# Patient Record
Sex: Male | Born: 1965 | Race: White | Hispanic: No | Marital: Married | Smoking: Former smoker
Health system: Southern US, Community
[De-identification: ages and names within clinical notes are randomized; demographics above are authoritative.]

## PROBLEM LIST (undated history)

## (undated) DIAGNOSIS — J45909 Unspecified asthma, uncomplicated: Secondary | ICD-10-CM

## (undated) DIAGNOSIS — E785 Hyperlipidemia, unspecified: Secondary | ICD-10-CM

## (undated) DIAGNOSIS — I10 Essential (primary) hypertension: Secondary | ICD-10-CM

## (undated) DIAGNOSIS — F329 Major depressive disorder, single episode, unspecified: Secondary | ICD-10-CM

## (undated) DIAGNOSIS — F32A Depression, unspecified: Secondary | ICD-10-CM

## (undated) DIAGNOSIS — R2230 Localized swelling, mass and lump, unspecified upper limb: Secondary | ICD-10-CM

## (undated) HISTORY — DX: Hyperlipidemia, unspecified: E78.5

## (undated) HISTORY — DX: Depression, unspecified: F32.A

## (undated) HISTORY — DX: Essential (primary) hypertension: I10

## (undated) HISTORY — DX: Localized swelling, mass and lump, unspecified upper limb: R22.30

## (undated) HISTORY — DX: Unspecified asthma, uncomplicated: J45.909

## (undated) HISTORY — DX: Major depressive disorder, single episode, unspecified: F32.9

## (undated) HISTORY — PX: KNEE CARTILAGE SURGERY: SHX688

## (undated) SURGERY — EXCISION MASS UPPER EXTREMITIES
Anesthesia: General | Laterality: Left

---

## 1998-09-17 HISTORY — PX: OTHER SURGICAL HISTORY: SHX169

## 2004-09-17 HISTORY — PX: NASAL SINUS SURGERY: SHX719

## 2009-09-17 HISTORY — PX: LAPAROSCOPIC SMALL BOWEL RESECTION: SUR793

## 2015-09-18 DIAGNOSIS — R2230 Localized swelling, mass and lump, unspecified upper limb: Secondary | ICD-10-CM

## 2015-09-18 HISTORY — DX: Localized swelling, mass and lump, unspecified upper limb: R22.30

## 2016-04-19 ENCOUNTER — Ambulatory Visit (INDEPENDENT_AMBULATORY_CARE_PROVIDER_SITE_OTHER): Payer: No Typology Code available for payment source | Admitting: Surgery

## 2016-04-19 ENCOUNTER — Encounter: Payer: Self-pay | Admitting: Surgery

## 2016-04-19 VITALS — BP 143/88 | HR 83 | Temp 98.5°F | Ht 71.0 in | Wt 267.4 lb

## 2016-04-19 DIAGNOSIS — M25512 Pain in left shoulder: Secondary | ICD-10-CM

## 2016-04-19 DIAGNOSIS — M259 Joint disorder, unspecified: Secondary | ICD-10-CM | POA: Diagnosis not present

## 2016-04-19 DIAGNOSIS — R2230 Localized swelling, mass and lump, unspecified upper limb: Secondary | ICD-10-CM | POA: Insufficient documentation

## 2016-04-19 NOTE — Progress Notes (Signed)
Subjective:     Patient ID: Sean Cain, male   DOB: [birth year], 50 y.o.   MRN: [id number]  HPI  50 year old male who is referred by the [facility] for left shoulder mass that has been there for about 5 years. Patient states that his been soft and fairly mobile and he noticed that it been getting bigger over the past few months. Patient states that it has been causing him pain over past few months as well.  Patient states this pain is down his arm and burning shooting pain. Patient states that this does happen more with moving the arm but sometimes can be whenever he is just sitting resting. Patient states this area has never become red and has never been draining but is beginning to cause him more pain and discomfort at this time. Patient did have a lipoma removed from his back will years ago. Otherwise he has had no other lesions removed. Patient himself does have a history of Crohn's disease and has been on both Remicade and now Humira and is on chronic prednisone for management of this.  Past Medical History:  Diagnosis Date  . Depression   . Hyperlipidemia   . Hypertension    Past Surgical History:  Procedure Laterality Date  . KNEE CARTILAGE SURGERY  2002, 2005  . LAPAROSCOPIC SMALL BOWEL RESECTION  2011  . NASAL SINUS SURGERY  2006  . nissenfundoplication  3474   Family History  Problem Relation Age of Onset  . Alzheimer's disease Mother   . Heart disease Father    Social History   Social History  . Marital status: Married    Spouse name: N/A  . Number of children: N/A  . Years of education: N/A   Social History Main Topics  . Smoking status: Former Smoker    Packs/day: 1.00    Years: 10.00    Quit date: 04/19/1998  . Smokeless tobacco: Former Systems developer  . Alcohol use No  . Drug use: No  . Sexual activity: Not Asked   Other Topics Concern  . None   Social History Narrative  . None    Current Outpatient Prescriptions:  .  Adalimumab (HUMIRA PEN-CROHNS STARTER) 40  MG/0.8ML PNKT, Inject 40 mg into the skin once a week., Disp: , Rfl:  .  ARIPiprazole (ABILIFY) 10 MG tablet, Take 10 mg by mouth daily., Disp: , Rfl:  .  atorvastatin (LIPITOR) 40 MG tablet, Take 40 mg by mouth daily., Disp: , Rfl:  .  buPROPion (ZYBAN) 150 MG 12 hr tablet, Take 150 mg by mouth 2 (two) times daily., Disp: , Rfl:  .  Cetirizine HCl (ZYRTEC ALLERGY) 10 MG CAPS, Take 1 tablet by mouth daily., Disp: , Rfl:  .  Cyclobenzaprine HCl-Liniment (CYCLOBENZAPRINE COMFORT PAC) 10 MG KIT, Take 1 tablet by mouth daily., Disp: , Rfl:  .  DULoxetine (CYMBALTA) 60 MG capsule, Take 60 mg by mouth daily., Disp: , Rfl:  .  hydrochlorothiazide (HYDRODIURIL) 12.5 MG tablet, Take 12.5 mg by mouth daily., Disp: , Rfl:  .  HYDROcodone-acetaminophen (NORCO/VICODIN) 5-325 MG tablet, Take 1 tablet by mouth every 6 (six) hours as needed for moderate pain., Disp: , Rfl:  .  lisinopril (PRINIVIL,ZESTRIL) 20 MG tablet, Take 20 mg by mouth daily., Disp: , Rfl:  .  meloxicam (MOBIC) 15 MG tablet, Take by mouth., Disp: , Rfl:  .  mesalamine (CANASA) 1000 MG suppository, Place 1,000 mg rectally at bedtime., Disp: , Rfl:  .  prednisoLONE 5 MG  TABS tablet, Take by mouth., Disp: , Rfl:  .  pregabalin (LYRICA) 100 MG capsule, Take 100 mg by mouth 3 (three) times daily., Disp: , Rfl:  Allergies  Allergen Reactions  . Flonase [Fluticasone Propionate]   . Remicade [Infliximab]   . Singulair [Montelukast Sodium]       Review of Systems  Constitutional: Negative for activity change, appetite change, chills, fatigue, fever and unexpected weight change.  HENT: Negative for congestion, sore throat and voice change.   Respiratory: Negative for cough, choking, shortness of breath and wheezing.   Cardiovascular: Negative for chest pain, palpitations and leg swelling.  Gastrointestinal: Negative for abdominal pain, blood in stool, constipation, diarrhea and nausea.  Genitourinary: Negative for dysuria and flank pain.   Musculoskeletal: Negative for back pain, gait problem and joint swelling.  Skin: Negative for color change, pallor, rash and wound.  Neurological: Negative for dizziness and seizures.  Hematological: Negative for adenopathy. Does not bruise/bleed easily.  Psychiatric/Behavioral: Negative for agitation.  All other systems reviewed and are negative.  Vitals:   04/19/16 1555  BP: (!) 143/88  Pulse: 83  Temp: 98.5 F (36.9 C)       Objective:   Physical Exam  Constitutional: He is oriented to person, place, and time. He appears well-developed and well-nourished. No distress.  HENT:  Head: Normocephalic and atraumatic.  Right Ear: External ear normal.  Left Ear: External ear normal.  Nose: Nose normal.  Mouth/Throat: Oropharynx is clear and moist. No oropharyngeal exudate.  Eyes: Conjunctivae and EOM are normal. Pupils are equal, round, and reactive to light. No scleral icterus.  Neck: Normal range of motion. Neck supple. No tracheal deviation present.  Cardiovascular: Normal rate, regular rhythm, normal heart sounds and intact distal pulses.  Exam reveals no gallop and no friction rub.   No murmur heard. Pulmonary/Chest: Effort normal and breath sounds normal. No respiratory distress. He has no wheezes. He has no rales.  Abdominal: Soft. Bowel sounds are normal. He exhibits no distension. There is no tenderness. There is no rebound.  Musculoskeletal: Normal range of motion. He exhibits tenderness. He exhibits no edema or deformity.  Neurological: He is alert and oriented to person, place, and time. No cranial nerve deficit.  Skin: Skin is warm and dry. No rash noted. No erythema. No pallor.  Left anterior shoulder: soft, mass, fixed, no freely mobile, approximately 8cm x 12cm in size just lateral to the joint capsule, no erythema or other skin markings, the mass moves with arm and shoulder movements, tender to palpation  Psychiatric: He has a normal mood and affect. His behavior is  normal. Judgment and thought content normal.  Vitals reviewed.      Assessment:     Left shoulder lesion   Plan:     I have personally reviewed the patient's medical history as sent by the [facility] as well as per patient report as above with his Crohn's disease and being on Humira and prednisone. I do not have any laboratory values from the [place] and there are no imaging studies of this lesion.  I discussed that given the soft rubbery nature of the lesion that is likely a lipoma however given the fact that is increased in size and causing pain down the arm as well as moving with the muscles would indicate that it does have a deeper component in could be attached to the fascia of the muscles or even ingrown into the muscle tissue. In order to assess the feasibility of  removal and its relation to tendons of the shoulder and in order an MRI of his left shoulder without contrast. The patient and wife understand the need for this test to assess the anatomy in the area and the depth of invasion of this lesion and this would give [place] a better idea of what type of lesion were dealing with. I will have the patient follow-up with me after he obtains the MRI first and then schedule his potential operation.  However the patient understands that if it were to be ingrown into muscle tissue or close to the tendons in the arm that may need the help of the orthopedic surgeon as well.

## 2016-04-19 NOTE — Patient Instructions (Signed)
We will schedule an MRI of your Left shoulder and call you with the appointment date and time and place. We will also schedule a follow up appointment with Dr.Loflin after your MRI to discuss the results.  Please call our office if you have any questions or concerns.

## 2016-04-20 ENCOUNTER — Telehealth: Payer: Self-pay

## 2016-04-20 NOTE — Telephone Encounter (Signed)
Spoke with patient at this time to let him know I have scheduled his MRI scheduled on 05/04/16 @ 9:45.  Follow up appointment is on 05/29/16 @8 :30 with Dr.Loflin in the [facility] office. Patient verbalized understanding.  MRI -[facility]  [place]

## 2016-05-04 ENCOUNTER — Ambulatory Visit
Admission: RE | Admit: 2016-05-04 | Discharge: 2016-05-04 | Disposition: A | Discharge status: Home / Self Care | Payer: No Typology Code available for payment source | Source: Ambulatory Visit | Attending: Surgery | Admitting: Surgery

## 2016-05-04 DIAGNOSIS — M25412 Effusion, left shoulder: Secondary | ICD-10-CM | POA: Insufficient documentation

## 2016-05-04 DIAGNOSIS — M25512 Pain in left shoulder: Secondary | ICD-10-CM | POA: Insufficient documentation

## 2016-05-04 DIAGNOSIS — D179 Benign lipomatous neoplasm, unspecified: Secondary | ICD-10-CM | POA: Insufficient documentation

## 2016-05-08 ENCOUNTER — Ambulatory Visit (INDEPENDENT_AMBULATORY_CARE_PROVIDER_SITE_OTHER): Payer: Self-pay

## 2016-05-08 ENCOUNTER — Other Ambulatory Visit: Payer: Self-pay | Admitting: Adult Health

## 2016-05-08 DIAGNOSIS — Z0000 Encounter for general adult medical examination without abnormal findings: Secondary | ICD-10-CM

## 2016-05-08 DIAGNOSIS — M79672 Pain in left foot: Secondary | ICD-10-CM

## 2016-05-29 ENCOUNTER — Ambulatory Visit: Payer: Self-pay | Admitting: Surgery

## 2016-06-04 ENCOUNTER — Ambulatory Visit: Payer: Self-pay | Admitting: Surgery

## 2016-06-07 ENCOUNTER — Ambulatory Visit (INDEPENDENT_AMBULATORY_CARE_PROVIDER_SITE_OTHER): Payer: No Typology Code available for payment source | Admitting: Surgery

## 2016-06-07 ENCOUNTER — Encounter: Payer: Self-pay | Admitting: Surgery

## 2016-06-07 VITALS — BP 134/79 | HR 84 | Temp 98.2°F | Ht 71.0 in | Wt 267.0 lb

## 2016-06-07 DIAGNOSIS — D1720 Benign lipomatous neoplasm of skin and subcutaneous tissue of unspecified limb: Secondary | ICD-10-CM | POA: Insufficient documentation

## 2016-06-07 NOTE — Progress Notes (Signed)
Subjective:     Patient ID: Sean Cain, male   DOB: [birth year], 50 y.o.   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 The patient was sent for an MRI in the meantime to ensure that this did not have any sarcoma characteristics. The patient states he still been having left shoulder pain and difficulty raising his arm due to the size of the lesion. Patient does not think it's been getting any bigger. Patient is not noticing any erythema or drainage over the area. Patient had any fever chills nausea vomiting diarrhea constipation or dysuria.  Past Medical History:  Diagnosis Date  . Depression   . Hyperlipidemia   . Hypertension   . Shoulder mass 2017   Past Surgical History:  Procedure Laterality Date  . KNEE CARTILAGE SURGERY  2002, 2005  . LAPAROSCOPIC SMALL BOWEL RESECTION  2011  . NASAL SINUS SURGERY  2006  . nissenfundoplication  0093   Family History  Problem Relation Age of Onset  . Alzheimer's disease Mother   . Heart disease Father    Social History   Social History  . Marital status: Married    Spouse name: N/A  . Number of children: N/A  . Years of education: N/A   Social History Main Topics  . Smoking status: Former Smoker    Packs/day: 1.00    Years: 10.00    Quit date: 04/19/1998  . Smokeless tobacco: Former Systems developer  . Alcohol use No  . Drug use: No  . Sexual activity: Not Asked   Other Topics Concern  . None   Social History Narrative  . None    Current Outpatient Prescriptions:  .  Adalimumab (HUMIRA PEN-CROHNS STARTER) 40 MG/0.8ML PNKT, Inject 40 mg into the skin once a week., Disp: , Rfl:  .  ARIPiprazole (ABILIFY) 10 MG tablet, Take 10 mg by mouth daily., Disp: , Rfl:  .  atorvastatin (LIPITOR) 40 MG tablet, Take 40 mg by mouth daily., Disp: , Rfl:  .  buPROPion (ZYBAN) 150 MG 12 hr tablet, Take 150 mg by mouth 2 (two) times daily., Disp: , Rfl:  .  Cetirizine HCl (ZYRTEC ALLERGY) 10 MG CAPS,  Take 1 tablet by mouth daily., Disp: , Rfl:  .  Cyclobenzaprine HCl-Liniment (CYCLOBENZAPRINE COMFORT PAC) 10 MG KIT, Take 1 tablet by mouth daily., Disp: , Rfl:  .  DULoxetine (CYMBALTA) 60 MG capsule, Take 60 mg by mouth daily., Disp: , Rfl:  .  hydrochlorothiazide (HYDRODIURIL) 12.5 MG tablet, Take 12.5 mg by mouth daily., Disp: , Rfl:  .  HYDROcodone-acetaminophen (NORCO/VICODIN) 5-325 MG tablet, Take 1 tablet by mouth every 6 (six) hours as needed for moderate pain., Disp: , Rfl:  .  lisinopril (PRINIVIL,ZESTRIL) 20 MG tablet, Take 20 mg by mouth daily., Disp: , Rfl:  .  pregabalin (LYRICA) 100 MG capsule, Take 100 mg by mouth 3 (three) times daily., Disp: , Rfl:  Allergies  Allergen Reactions  . Flonase [Fluticasone Propionate]   . Remicade [Infliximab]   . Singulair [Montelukast Sodium]   . Montelukast Other (See Comments)    Other Reaction: Other reaction      Review of Systems  Constitutional: Negative.   HENT: Negative.   Respiratory: Negative.   Cardiovascular: Negative.   Gastrointestinal: Negative.   Genitourinary: Negative.   Musculoskeletal: Negative.   Skin: Negative.   Neurological: Negative.   Hematological: Negative.   Psychiatric/Behavioral: Negative.   All other systems  reviewed and are negative.      Vitals:   06/07/16 0840  BP: 134/79  Pulse: 84  Temp: 98.2 F (36.8 C)    Objective:   Physical Exam  Constitutional: He is oriented to person, place, and time. He appears well-developed and well-nourished. No distress.  HENT:  Head: Normocephalic and atraumatic.  Right Ear: External ear normal.  Left Ear: External ear normal.  Nose: Nose normal.  Mouth/Throat: Oropharynx is clear and moist. No oropharyngeal exudate.  Eyes: Conjunctivae and EOM are normal. Pupils are equal, round, and reactive to light. No scleral icterus.  Neck: Normal range of motion. Neck supple. No tracheal deviation present.  Cardiovascular: Normal rate, regular rhythm,  normal heart sounds and intact distal pulses.  Exam reveals no gallop and no friction rub.   No murmur heard. Pulmonary/Chest: Effort normal and breath sounds normal. No respiratory distress. He has no wheezes. He has no rales.  Abdominal: Soft. Bowel sounds are normal. He exhibits no distension. There is no tenderness. There is no rebound.  Musculoskeletal: Normal range of motion. He exhibits no edema, tenderness or deformity.  Neurological: He is alert and oriented to person, place, and time. No cranial nerve deficit.  Skin: Skin is warm and dry. No rash noted. No erythema.  Left upper arm: 11cm x 4cm mobile soft lipoma, minimally tender with manipulation, no erythema or drainage  Psychiatric: He has a normal mood and affect. His behavior is normal. Judgment and thought content normal.  Vitals reviewed.  MRI:  Muscles: No focal muscular atrophy or edema. There is a lipoma laterally within the deltoid muscle which measures approximately 11.3 x 5.0 x 4.0 cm. The inferior extent of this is incompletely visualized. The lipoma is well-defined with mild interdigitating vessels and muscle, but no worrisome features.    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 I have also reviewed the radiology read as above. I did discuss with him that when lipomas of this large there is a small chance it could be a sarcoma however with a good characteristics seen on MRI that is more unlikely. I discussed with him the risk benefits alternatives and expected outcomes of removing this area to include the risk of bleeding, infection, need for drain placement, need for overnight hospital stay, damage to muscle or nerves, bleeding requiring blood transfusion, seroma or hematoma that would require drainage or further procedures and  potentially interfering with the tattoo over the area. Patient is given opportunity to ask questions and have them answered. Patient is in agreement with this plan due to family situations he is unable to have this until November we'll schedule that time.

## 2016-06-07 NOTE — Patient Instructions (Signed)
Please call our office if you have questions or concerns. Please see blue sheet for surgery information. We have your surgery scheduled for 07/24/16 with Dr.Loflin.

## 2016-06-08 ENCOUNTER — Telehealth: Payer: Self-pay | Admitting: Surgery

## 2016-06-08 NOTE — Telephone Encounter (Signed)
Pt advised of pre op date/time and sx date. Sx: 06/19/16 with Dr Loflin--Excision of left arm lipoma.  Pre op: 06/13/16 between 1-5:00pm--Phone.   Patient made aware to call [phone], between 1-3:00pm the day before surgery, to find out what time to arrive.

## 2016-06-12 ENCOUNTER — Telehealth: Payer: Self-pay | Admitting: Surgery

## 2016-06-12 NOTE — Telephone Encounter (Signed)
Patient has surgery scheduled on October 3rd with Dr Azalee Course. He said he spoke with you earlier about possibly changing his date. He has decided he would like to change the date to October 20th with Dr Burt Knack so that his wife can be with him during surgery. Please call and advise.

## 2016-06-13 ENCOUNTER — Encounter
Admission: RE | Admit: 2016-06-13 | Discharge: 2016-06-13 | Disposition: A | Discharge status: Home / Self Care | Payer: No Typology Code available for payment source | Source: Ambulatory Visit | Attending: Surgery | Admitting: Surgery

## 2016-06-13 ENCOUNTER — Telehealth: Payer: Self-pay

## 2016-06-13 ENCOUNTER — Other Ambulatory Visit: Payer: Self-pay

## 2016-06-13 DIAGNOSIS — D1720 Benign lipomatous neoplasm of skin and subcutaneous tissue of unspecified limb: Secondary | ICD-10-CM

## 2016-06-13 NOTE — Patient Instructions (Addendum)
Your procedure is scheduled on: 07/04/16 Report to Day Surgery. To find out your arrival time please call [phone] between 1PM - 3PM on 07/03/16.  Remember: Instructions that are not followed completely may result in serious medical risk, up to and including death, or upon the discretion of your surgeon and anesthesiologist your surgery may need to be rescheduled.    __X__ 1. Do not eat food or drink liquids after midnight. No gum chewing or hard candies.     __X__ 2. No Alcohol/ NO SMOKING for 24 hours before or after surgery.   ____ 3. Bring all medications with you on the day of surgery if instructed.    __X__ 4. Notify your doctor if there is any change in your medical condition     (cold, fever, infections).     Do not wear jewelry, make-up, hairpins, clips or nail polish.  Do not wear lotions, powders, or perfumes.   Do not shave 48 hours prior to surgery. Men may shave face and neck.  Do not bring valuables to the hospital.    [facility] is not responsible for any belongings or valuables.               Contacts, dentures or bridgework may not be worn into surgery.  Leave your suitcase in the car. After surgery it may be brought to your room.  For patients admitted to the hospital, discharge time is determined by your                treatment team.   Patients discharged the day of surgery will not be allowed to drive home.   Please read over the following fact sheets that you were given:      __X__ Take these medicines the morning of surgery with A SIP OF WATER:    1. ABILIFY  2. BUPROPION  3. CYMBALTA  4. LISINOPRIL  5. LYRICA  6.  ____ Fleet Enema (as directed)   __X__ Use CHG Soap as directed  ____ Use inhalers on the day of surgery  ____ Stop metformin 2 days prior to surgery    ____ Take 1/2 of usual insulin dose the night before surgery and none on the morning of surgery.   ____ Stop Coumadin/Plavix/aspirin on   ____ Stop Anti-inflammatories on     ____ Stop supplements until after surgery.    ____ Bring C-Pap to the hospital.

## 2016-06-13 NOTE — Telephone Encounter (Signed)
Patient would like to reschedule his surgery once again from 10/18 to 10/19 with Dr. Burt Knack. His wife will not be in town on 07/04/16.  Please call with new surgery information.

## 2016-06-13 NOTE — Telephone Encounter (Signed)
Per patient's request and approval from Dr Azalee Course, patient's surgery has been moved to accomodates patient's request.   Pt advised of pre op date/time and sx date. Sx: 07/04/16 with Dr Loflin--Excision of left arm lipoma.  Pre op: 06/13/16 between 1-5:00pm--phone.   Patient made aware to call [phone], between 1-3:00pm the day before surgery, to find out what time to arrive.

## 2016-06-14 NOTE — Telephone Encounter (Signed)
Surgery has been changed to 07/05/16 with Dr Burt Knack. Patient is scheduled to see Dr Burt Knack in the office on 06/18/16.   I will review all surgery information with the patient once he has his office visit.

## 2016-06-15 ENCOUNTER — Inpatient Hospital Stay: Admission: RE | Admit: 2016-06-15 | Payer: No Typology Code available for payment source | Source: Ambulatory Visit

## 2016-06-18 ENCOUNTER — Ambulatory Visit (INDEPENDENT_AMBULATORY_CARE_PROVIDER_SITE_OTHER): Payer: No Typology Code available for payment source | Admitting: Surgery

## 2016-06-18 ENCOUNTER — Encounter: Payer: Self-pay | Admitting: Surgery

## 2016-06-18 ENCOUNTER — Ambulatory Visit: Payer: No Typology Code available for payment source | Admitting: Surgery

## 2016-06-18 VITALS — BP 146/75 | HR 79 | Temp 98.7°F | Ht 73.0 in | Wt 267.0 lb

## 2016-06-18 DIAGNOSIS — D1720 Benign lipomatous neoplasm of skin and subcutaneous tissue of unspecified limb: Secondary | ICD-10-CM | POA: Diagnosis not present

## 2016-06-18 NOTE — Progress Notes (Signed)
Outpatient Surgical Follow Up  06/18/2016  Sean Cain is an 50 y.o. male.   CC: Left shoulder pain  HPI: This patient with left shoulder pain and a growing left shoulder lipoma. He was seen by Dr. Heath Lark who suggested surgery and he wants to schedule a different date. Therefore I was asked see the patient. A she is medically disabled. He states this mass is been there for 5 date years and slowly growing causing him some pain.  Patient has Crohn's disease and has held his Humira dose.  Past Medical History:  Diagnosis Date  . Depression   . Hyperlipidemia   . Hypertension   . Shoulder mass 2017    Past Surgical History:  Procedure Laterality Date  . KNEE CARTILAGE SURGERY  2002, 2005  . LAPAROSCOPIC SMALL BOWEL RESECTION  2011  . NASAL SINUS SURGERY  2006  . nissenfundoplication  [id number]    Family History  Problem Relation Age of Onset  . Alzheimer's disease Mother   . Heart disease Father     Social History:  reports that he quit smoking about 18 years ago. He has a 10.00 pack-year smoking history. He has quit using smokeless tobacco. He reports that he does not drink alcohol or use drugs.  Allergies:  Allergies  Allergen Reactions  . Flonase [Fluticasone Propionate]   . Remicade [Infliximab]   . Singulair [Montelukast Sodium]   . Montelukast Other (See Comments)    Other Reaction: Other reaction    Medications reviewed.   Review of Systems:   Review of Systems  Constitutional: Negative.   HENT: Negative.   Eyes: Negative.   Respiratory: Negative.   Cardiovascular: Negative.   Gastrointestinal: Negative.   Genitourinary: Negative.   Musculoskeletal: Positive for joint pain. Negative for myalgias and neck pain.  Skin: Negative.   Neurological: Negative.   Endo/Heme/Allergies: Negative.   Psychiatric/Behavioral: Negative.      Physical Exam:  BP (!) 146/75   Pulse 79   Temp 98.7 F (37.1 C) (Oral)   Ht 6\' 1"  (1.854 m)   Wt 267 lb (121.1  kg)   BMI 35.23 kg/m   Physical Exam  Constitutional: He is oriented to person, place, and time and well-developed, well-nourished, and in no distress. No distress.  HENT:  Head: Normocephalic and atraumatic.  Eyes: Pupils are equal, round, and reactive to light. Right eye exhibits no discharge. Left eye exhibits no discharge. No scleral icterus.  Neck: Normal range of motion.  Cardiovascular: Normal rate, regular rhythm and normal heart sounds.   Pulmonary/Chest: Effort normal and breath sounds normal. No respiratory distress. He has no wheezes. He has no rales.  Musculoskeletal: Normal range of motion. He exhibits deformity. He exhibits no edema.  Left shoulder mass measuring approximately 10 cm x 4 cm. It is soft and fleshy but non-mobile. This underlies a very complex and colorful tattoo.  Lymphadenopathy:    He has no cervical adenopathy.  Neurological: He is alert and oriented to person, place, and time.  Skin: Skin is warm and dry. No rash noted. He is not diaphoretic. No erythema.  Psychiatric: Mood and affect normal.  Vitals reviewed.     No results found for this or any previous visit (from the past 48 hour(s)). No results found.  Assessment/Plan:  This patient with a large lipoma of the left shoulder to submuscular in nature. I reviewed his MRI films personally. I am in agreement that excision is necessary in this patient but I also  discussed with him the high risk of recurrence with these type of lipomas in the submuscular position and the risks of bleeding infection cosmetic deformity including altering his tattoo that did not bother him and wished to proceed.  Florene Glen, MD, FACS

## 2016-06-18 NOTE — Patient Instructions (Signed)
We have arranged for your Lipoma to be removed on 07/05/16 by Dr. Burt Knack at [facility]. Please see your blue pre-care sheet for further information  Lipoma A lipoma is a noncancerous (benign) tumor that is made up of fat cells. This is a very common type of soft-tissue growth. Lipomas are usually found under the skin (subcutaneous). They may occur in any tissue of the body that contains fat. Common areas for lipomas to appear include the back, shoulders, buttocks, and thighs. Lipomas grow slowly, and they are usually painless. Most lipomas do not cause problems and do not require treatment. CAUSES The cause of this condition is not known. RISK FACTORS This condition is more likely to develop in:  People who are 49-67 years old.  People who have a family history of lipomas. SYMPTOMS A lipoma usually appears as a small, round bump under the skin. It may feel soft or rubbery, but the firmness can vary. Most lipomas are not painful. However, a lipoma may become painful if it is located in an area where it pushes on nerves. DIAGNOSIS A lipoma can usually be diagnosed with a physical exam. You may also have tests to confirm the diagnosis and to rule out other conditions. Tests may include:  Imaging tests, such as a CT scan or MRI.  Removal of a tissue sample to be looked at under a microscope (biopsy). TREATMENT Treatment is not needed for small lipomas that are not causing problems. If a lipoma continues to get bigger or it causes problems, removal is often the best option. Lipomas can also be removed to improve appearance. Removal of a lipoma is usually done with a surgery in which the fatty cells and the surrounding capsule are removed. Most often, a medicine that numbs the area (local anesthetic) is used for this procedure. HOME CARE INSTRUCTIONS  Keep all follow-up visits as directed by your health care provider. This is important. SEEK MEDICAL CARE IF:  Your lipoma becomes larger or  hard.  Your lipoma becomes painful, red, or increasingly swollen. These could be signs of infection or a more serious condition.   This information is not intended to replace advice given to you by your health care provider. Make sure you discuss any questions you have with your health care provider.   Document Released: 08/24/2002 Document Revised: 01/18/2015 Document Reviewed: 08/30/2014 Elsevier Interactive Patient Education [employer].

## 2016-06-20 ENCOUNTER — Telehealth: Payer: Self-pay | Admitting: Surgery

## 2016-06-20 NOTE — Telephone Encounter (Signed)
Pt advised of pre op date/time and sx date. Sx: 07/05/16 with Dr Cooper--Excision mass upper left arm shoulder.  Pre op: 06/15/16--phone.   Patient made aware to call [phone], between 1-3:00pm the day before surgery, to find out what time to arrive.

## 2016-06-22 ENCOUNTER — Encounter
Admission: RE | Admit: 2016-06-22 | Discharge: 2016-06-22 | Disposition: A | Discharge status: Home / Self Care | Payer: No Typology Code available for payment source | Source: Ambulatory Visit | Attending: Surgery | Admitting: Surgery

## 2016-06-22 DIAGNOSIS — D1722 Benign lipomatous neoplasm of skin and subcutaneous tissue of left arm: Secondary | ICD-10-CM | POA: Insufficient documentation

## 2016-06-22 DIAGNOSIS — I10 Essential (primary) hypertension: Secondary | ICD-10-CM | POA: Diagnosis not present

## 2016-06-22 DIAGNOSIS — Z01818 Encounter for other preprocedural examination: Secondary | ICD-10-CM | POA: Diagnosis not present

## 2016-06-22 LAB — CBC WITH DIFFERENTIAL/PLATELET
Basophils Absolute: 0.1 10*3/uL (ref 0–0.1)
Basophils Relative: 1 %
EOS PCT: 5 %
Eosinophils Absolute: 0.4 10*3/uL (ref 0–0.7)
HCT: 42.1 % (ref 40.0–52.0)
HEMOGLOBIN: 14.7 g/dL (ref 13.0–18.0)
LYMPHS ABS: 2.1 10*3/uL (ref 1.0–3.6)
LYMPHS PCT: 29 %
MCH: 31.3 pg (ref 26.0–34.0)
MCHC: 34.9 g/dL (ref 32.0–36.0)
MCV: 89.6 fL (ref 80.0–100.0)
MONOS PCT: 20 %
Monocytes Absolute: 1.5 10*3/uL — ABNORMAL HIGH (ref 0.2–1.0)
NEUTROS PCT: 45 %
Neutro Abs: 3.4 10*3/uL (ref 1.4–6.5)
Platelets: 174 10*3/uL (ref 150–440)
RBC: 4.69 MIL/uL (ref 4.40–5.90)
RDW: 14.6 % — ABNORMAL HIGH (ref 11.5–14.5)
WBC: 7.5 10*3/uL (ref 3.8–10.6)

## 2016-06-22 LAB — COMPREHENSIVE METABOLIC PANEL
ALK PHOS: 74 U/L (ref 38–126)
ALT: 36 U/L (ref 17–63)
AST: 34 U/L (ref 15–41)
Albumin: 4.1 g/dL (ref 3.5–5.0)
Anion gap: 5 (ref 5–15)
BUN: 11 mg/dL (ref 6–20)
CALCIUM: 9.3 mg/dL (ref 8.9–10.3)
CO2: 29 mmol/L (ref 22–32)
CREATININE: 1.54 mg/dL — AB (ref 0.61–1.24)
Chloride: 108 mmol/L (ref 101–111)
GFR, EST AFRICAN AMERICAN: 59 mL/min — AB (ref >60)
GFR, EST NON AFRICAN AMERICAN: 51 mL/min — AB (ref >60)
Glucose, Bld: 67 mg/dL (ref 65–99)
Potassium: 4 mmol/L (ref 3.5–5.1)
Sodium: 142 mmol/L (ref 135–145)
TOTAL PROTEIN: 7.3 g/dL (ref 6.5–8.1)

## 2016-06-22 LAB — PROTIME-INR
INR: 0.97
PROTHROMBIN TIME: 12.9 s (ref 11.4–15.2)

## 2016-06-22 LAB — APTT: aPTT: 29 seconds (ref 24–36)

## 2016-07-04 ENCOUNTER — Other Ambulatory Visit: Payer: Self-pay

## 2016-07-05 ENCOUNTER — Ambulatory Visit: Payer: No Typology Code available for payment source | Admitting: Anesthesiology

## 2016-07-05 ENCOUNTER — Ambulatory Visit
Admission: RE | Admit: 2016-07-05 | Discharge: 2016-07-05 | Disposition: A | Discharge status: Home / Self Care | Payer: No Typology Code available for payment source | Source: Ambulatory Visit | Attending: Surgery | Admitting: Surgery

## 2016-07-05 ENCOUNTER — Encounter
Admission: RE | Disposition: A | Discharge status: Home / Self Care | Payer: Self-pay | Source: Ambulatory Visit | Attending: Surgery

## 2016-07-05 DIAGNOSIS — Z6834 Body mass index (BMI) 34.0-34.9, adult: Secondary | ICD-10-CM | POA: Insufficient documentation

## 2016-07-05 DIAGNOSIS — E669 Obesity, unspecified: Secondary | ICD-10-CM | POA: Insufficient documentation

## 2016-07-05 DIAGNOSIS — D179 Benign lipomatous neoplasm, unspecified: Secondary | ICD-10-CM | POA: Insufficient documentation

## 2016-07-05 DIAGNOSIS — F329 Major depressive disorder, single episode, unspecified: Secondary | ICD-10-CM | POA: Diagnosis not present

## 2016-07-05 DIAGNOSIS — Z87891 Personal history of nicotine dependence: Secondary | ICD-10-CM | POA: Insufficient documentation

## 2016-07-05 DIAGNOSIS — D1722 Benign lipomatous neoplasm of skin and subcutaneous tissue of left arm: Secondary | ICD-10-CM | POA: Diagnosis not present

## 2016-07-05 DIAGNOSIS — E785 Hyperlipidemia, unspecified: Secondary | ICD-10-CM | POA: Insufficient documentation

## 2016-07-05 DIAGNOSIS — I10 Essential (primary) hypertension: Secondary | ICD-10-CM | POA: Insufficient documentation

## 2016-07-05 DIAGNOSIS — Z79899 Other long term (current) drug therapy: Secondary | ICD-10-CM | POA: Diagnosis not present

## 2016-07-05 HISTORY — PX: LIPOMA EXCISION: SHX5283

## 2016-07-05 SURGERY — EXCISION LIPOMA
Anesthesia: General | Laterality: Left

## 2016-07-05 MED ORDER — FENTANYL CITRATE (PF) 100 MCG/2ML IJ SOLN
INTRAMUSCULAR | Status: AC
  Administered 2016-07-05: 25 ug via INTRAVENOUS
  Filled 2016-07-05: qty 2

## 2016-07-05 MED ORDER — BUPIVACAINE-EPINEPHRINE (PF) 0.25% -1:200000 IJ SOLN
INTRAMUSCULAR | Status: DC | PRN
  Administered 2016-07-05: 20 mL via PERINEURAL

## 2016-07-05 MED ORDER — CHLORHEXIDINE GLUCONATE CLOTH 2 % EX PADS
6.0000 | MEDICATED_PAD | Freq: Once | CUTANEOUS | Status: AC
  Administered 2016-07-05: 6 via TOPICAL

## 2016-07-05 MED ORDER — HYDROCODONE-ACETAMINOPHEN 5-325 MG PO TABS
ORAL_TABLET | ORAL | Status: AC
  Administered 2016-07-05: 1 via ORAL
  Filled 2016-07-05: qty 1

## 2016-07-05 MED ORDER — FAMOTIDINE 20 MG PO TABS
ORAL_TABLET | ORAL | Status: AC
  Administered 2016-07-05: 20 mg via ORAL
  Filled 2016-07-05: qty 1

## 2016-07-05 MED ORDER — FENTANYL CITRATE (PF) 100 MCG/2ML IJ SOLN
25.0000 ug | INTRAMUSCULAR | Status: DC | PRN
  Administered 2016-07-05 (×3): 25 ug via INTRAVENOUS

## 2016-07-05 MED ORDER — LACTATED RINGERS IV SOLN
INTRAVENOUS | Status: DC
  Administered 2016-07-05: 10:00:00 via INTRAVENOUS

## 2016-07-05 MED ORDER — FAMOTIDINE 20 MG PO TABS
20.0000 mg | ORAL_TABLET | Freq: Once | ORAL | Status: AC
  Administered 2016-07-05: 20 mg via ORAL

## 2016-07-05 MED ORDER — MIDAZOLAM HCL 5 MG/5ML IJ SOLN
INTRAMUSCULAR | Status: DC | PRN
  Administered 2016-07-05: 2 mg via INTRAVENOUS

## 2016-07-05 MED ORDER — HYDROCODONE-ACETAMINOPHEN 5-325 MG PO TABS
1.0000 | ORAL_TABLET | Freq: Once | ORAL | Status: AC
  Administered 2016-07-05: 1 via ORAL

## 2016-07-05 MED ORDER — PHENYLEPHRINE HCL 10 MG/ML IJ SOLN
INTRAMUSCULAR | Status: DC | PRN
  Administered 2016-07-05 (×3): 100 ug via INTRAVENOUS

## 2016-07-05 MED ORDER — BUPIVACAINE-EPINEPHRINE (PF) 0.25% -1:200000 IJ SOLN
INTRAMUSCULAR | Status: AC
  Filled 2016-07-05: qty 30

## 2016-07-05 MED ORDER — HYDROCODONE-ACETAMINOPHEN 7.5-325 MG PO TABS: 1.0000 | ORAL_TABLET | Freq: Once | ORAL | Status: DC

## 2016-07-05 MED ORDER — PROPOFOL 10 MG/ML IV BOLUS
INTRAVENOUS | Status: DC | PRN
  Administered 2016-07-05: 170 mg via INTRAVENOUS

## 2016-07-05 MED ORDER — LIDOCAINE HCL (CARDIAC) 20 MG/ML IV SOLN
INTRAVENOUS | Status: DC | PRN
  Administered 2016-07-05: 100 mg via INTRAVENOUS

## 2016-07-05 MED ORDER — GLYCOPYRROLATE 0.2 MG/ML IJ SOLN
INTRAMUSCULAR | Status: DC | PRN
  Administered 2016-07-05: 0.2 mg via INTRAVENOUS

## 2016-07-05 MED ORDER — FENTANYL CITRATE (PF) 100 MCG/2ML IJ SOLN
INTRAMUSCULAR | Status: DC | PRN
  Administered 2016-07-05 (×2): 50 ug via INTRAVENOUS

## 2016-07-05 MED ORDER — CEFAZOLIN SODIUM-DEXTROSE 2-4 GM/100ML-% IV SOLN
2.0000 g | INTRAVENOUS | Status: AC
  Administered 2016-07-05: 2 g via INTRAVENOUS

## 2016-07-05 MED ORDER — ONDANSETRON HCL 4 MG/2ML IJ SOLN
INTRAMUSCULAR | Status: DC | PRN
  Administered 2016-07-05: 4 mg via INTRAVENOUS

## 2016-07-05 MED ORDER — HYDROCODONE-ACETAMINOPHEN 5-325 MG PO TABS: 1.0000 | ORAL_TABLET | ORAL | 0 refills | Status: DC | PRN

## 2016-07-05 MED ORDER — ONDANSETRON HCL 4 MG/2ML IJ SOLN: 4.0000 mg | Freq: Once | INTRAMUSCULAR | Status: DC | PRN

## 2016-07-05 SURGICAL SUPPLY — 31 items
ADHESIVE MASTISOL STRL (MISCELLANEOUS) ×3 IMPLANT
APPLIER CLIP 9.375 SM OPEN (CLIP) ×3
BLADE SURG 15 STRL LF DISP TIS (BLADE) ×1 IMPLANT
BLADE SURG 15 STRL SS (BLADE) ×2
CHLORAPREP W/TINT 26ML (MISCELLANEOUS) ×3 IMPLANT
CLIP APPLIE 9.375 SM OPEN (CLIP) ×1 IMPLANT
CLOSURE WOUND 1/2 X4 (GAUZE/BANDAGES/DRESSINGS) ×1
DRAIN PENROSE 1/4X12 LTX (DRAIN) IMPLANT
DRAPE LAPAROTOMY 100X77 ABD (DRAPES) ×3 IMPLANT
ELECT CAUTERY BLADE 6.4 (BLADE) ×3 IMPLANT
ELECT REM PT RETURN 9FT ADLT (ELECTROSURGICAL) ×3
ELECTRODE REM PT RTRN 9FT ADLT (ELECTROSURGICAL) ×1 IMPLANT
GAUZE SPONGE 4X4 12PLY STRL (GAUZE/BANDAGES/DRESSINGS) ×3 IMPLANT
GLOVE BIO SURGEON STRL SZ8 (GLOVE) ×3 IMPLANT
GOWN STRL REUS W/ TWL LRG LVL3 (GOWN DISPOSABLE) ×2 IMPLANT
GOWN STRL REUS W/TWL LRG LVL3 (GOWN DISPOSABLE) ×4
LABEL OR SOLS (LABEL) ×3 IMPLANT
MARGIN MAP 10MM (MISCELLANEOUS) IMPLANT
NEEDLE HYPO 22GX1.5 SAFETY (NEEDLE) ×3 IMPLANT
NS IRRIG 500ML POUR BTL (IV SOLUTION) ×3 IMPLANT
PAD ABD DERMACEA PRESS 5X9 (GAUZE/BANDAGES/DRESSINGS) IMPLANT
SPONGE LAP 18X18 5 PK (GAUZE/BANDAGES/DRESSINGS) ×3 IMPLANT
STRIP CLOSURE SKIN 1/2X4 (GAUZE/BANDAGES/DRESSINGS) ×2 IMPLANT
SUT ETHILON 2 0 FS 18 (SUTURE) IMPLANT
SUT ETHILON 3-0 FS-10 30 BLK (SUTURE) ×3
SUT VIC AB 2-0 CT2 27 (SUTURE) ×6 IMPLANT
SUT VIC AB 3-0 SH 27 (SUTURE) ×10
SUT VIC AB 3-0 SH 27X BRD (SUTURE) ×5 IMPLANT
SUTURE EHLN 3-0 FS-10 30 BLK (SUTURE) ×1 IMPLANT
SYR BULB EAR ULCER 3OZ GRN STR (SYRINGE) ×3 IMPLANT
SYRINGE 10CC LL (SYRINGE) ×3 IMPLANT

## 2016-07-05 NOTE — Anesthesia Procedure Notes (Signed)
Procedure Name: LMA Insertion Date/Time: 07/05/2016 12:04 PM Performed by: Silvana Newness Pre-anesthesia Checklist: Patient identified, Emergency Drugs available, Suction available, Patient being monitored and Timeout performed Patient Re-evaluated:Patient Re-evaluated prior to inductionOxygen Delivery Method: Circle system utilized Preoxygenation: Pre-oxygenation with 100% oxygen Intubation Type: IV induction Ventilation: Mask ventilation without difficulty LMA: LMA inserted LMA Size: 5.0 Number of attempts: 1 Placement Confirmation: positive ETCO2 and breath sounds checked- equal and bilateral Tube secured with: Tape Dental Injury: Teeth and Oropharynx as per pre-operative assessment

## 2016-07-05 NOTE — Anesthesia Postprocedure Evaluation (Signed)
Anesthesia Post Note  Patient: Sean Cain  Procedure(s) Performed: Procedure(s) (LRB): EXCISION LIPOMA (Left)  Patient location during evaluation: PACU Anesthesia Type: General Level of consciousness: awake and alert Pain management: pain level controlled Vital Signs Assessment: post-procedure vital signs reviewed and stable Respiratory status: spontaneous breathing, nonlabored ventilation, respiratory function stable and patient connected to nasal cannula oxygen Cardiovascular status: blood pressure returned to baseline and stable Postop Assessment: no signs of nausea or vomiting Anesthetic complications: no    Last Vitals:  Vitals:   07/05/16 1427 07/05/16 1458  BP: 121/82 99/69  Pulse: 64 63  Resp: 14 16  Temp: (!) 36.1 C     Last Pain:  Vitals:   07/05/16 1458  TempSrc:   PainSc: Ronda

## 2016-07-05 NOTE — Progress Notes (Signed)
Preoperative Review   Patient is met in the preoperative holding area. The history is reviewed in the chart and with the patient. Patient is reexamined and marked on the left shoulder. I personally reviewed the options and rationale as well as the risks of this procedure that have been previously discussed with the patient. I also discussed in detail the risk of cosmetic problems with his existing tattoo. I also reemphasized the significant risk of recurrence with these intramuscular lipomas. All questions asked by the patient and/or family were answered to their satisfaction.  Patient agrees to proceed with this procedure at this time.  Florene Glen M.D. FACS

## 2016-07-05 NOTE — Discharge Instructions (Signed)
Remove dressing in 24 hours. °May shower in 24 hours. °Leave paper strips in place. °Resume all home medications. °Follow-up with Dr. Cooper in 10 days.AMBULATORY SURGERY  °DISCHARGE INSTRUCTIONS ° ° °1) The drugs that you were given will stay in your system until tomorrow so for the next 24 hours you should not: ° °A) Drive an automobile °B) Make any legal decisions °C) Drink any alcoholic beverage ° ° °2) You may resume regular meals tomorrow.  Today it is better to start with liquids and gradually work up to solid foods. ° °You may eat anything you prefer, but it is better to start with liquids, then soup and crackers, and gradually work up to solid foods. ° ° °3) Please notify your doctor immediately if you have any unusual bleeding, trouble breathing, redness and pain at the surgery site, drainage, fever, or pain not relieved by medication. ° ° ° °4) Additional Instructions: ° ° ° ° ° ° ° °Please contact your physician with any problems or Same Day Surgery at [phone], Monday through Friday 6 am to 4 pm, or [place] [place] number at [phone].AMBULATORY SURGERY  °DISCHARGE INSTRUCTIONS ° ° °5) The drugs that you were given will stay in your system until tomorrow so for the next 24 hours you should not: ° °D) Drive an automobile °E) Make any legal decisions °F) Drink any alcoholic beverage ° ° °6) You may resume regular meals tomorrow.  Today it is better to start with liquids and gradually work up to solid foods. ° °You may eat anything you prefer, but it is better to start with liquids, then soup and crackers, and gradually work up to solid foods. ° ° °7) Please notify your doctor immediately if you have any unusual bleeding, trouble breathing, redness and pain at the surgery site, drainage, fever, or pain not relieved by medication. ° ° ° °8) Additional Instructions: ° ° ° ° ° ° ° °Please contact your physician with any problems or Same Day Surgery at [phone], Monday through Friday 6  am to 4 pm, or [facility] number at [phone]. °

## 2016-07-05 NOTE — Transfer of Care (Signed)
Immediate Anesthesia Transfer of Care Note  Patient: Sean Cain  Procedure(s) Performed: Procedure(s): EXCISION LIPOMA (Left)  Patient Location: PACU  Anesthesia Type:General  Level of Consciousness: awake, alert , oriented and patient cooperative  Airway & Oxygen Therapy: Patient Spontanous Breathing and Patient connected to face mask oxygen  Post-op Assessment: Report given to RN, Post -op Vital signs reviewed and stable and Patient moving all extremities X 4  Post vital signs: Reviewed and stable  Last Vitals:  Vitals:   07/05/16 0958  BP: (!) 120/100  Pulse: 75  Resp: 18  Temp: 36.7 C    Last Pain:  Vitals:   07/05/16 0958  TempSrc: Oral  PainSc: 5          Complications: No apparent anesthesia complications

## 2016-07-05 NOTE — Anesthesia Preprocedure Evaluation (Addendum)
Anesthesia Evaluation  Patient identified by MRN, date of birth, ID band Patient awake    Reviewed: Allergy & Precautions, NPO status , Patient's Chart, lab work & pertinent test results, reviewed documented beta blocker date and time   Airway Mallampati: III  TM Distance: >3 FB     Dental  (+) Chipped   Pulmonary former smoker,           Cardiovascular hypertension, Pt. on medications      Neuro/Psych PSYCHIATRIC DISORDERS Depression    GI/Hepatic   Endo/Other    Renal/GU      Musculoskeletal   Abdominal   Peds  Hematology   Anesthesia Other Findings Obese. EKG normal.  Reproductive/Obstetrics                            Anesthesia Physical Anesthesia Plan  ASA: III  Anesthesia Plan: General   Post-op Pain Management:    Induction: Intravenous  Airway Management Planned: LMA  Additional Equipment:   Intra-op Plan:   Post-operative Plan:   Informed Consent: I have reviewed the patients History and Physical, chart, labs and discussed the procedure including the risks, benefits and alternatives for the proposed anesthesia with the patient or authorized representative who has indicated his/her understanding and acceptance.     Plan Discussed with: CRNA  Anesthesia Plan Comments:       Anesthesia Quick Evaluation

## 2016-07-05 NOTE — Op Note (Signed)
07/05/2016  1:00 PM  PATIENT:  Sean Cain  50 y.o. male  PRE-OPERATIVE DIAGNOSIS:  Left shoulder mass  POST-OPERATIVE DIAGNOSIS:  Left submuscular lipoma  PROCEDURE: Excision of left shoulder submuscular lipoma  SURGEON:  Florene Glen MD, FACS   ANESTHESIA:   Gen. with LMA  Asst.: PA student   Details of Procedure: Patient was met in the preoperative holding area and marked we discussed the rationale for surgery the options of observation and the risks of bleeding infection recurrence and cosmetic deformity including D ranging his tattoo. We also discussed the risk of recurrence and emphasized this risk for submuscular lipomatous.  She was brought to the operating room and induced to general anesthesia prepped and draped in a sterile fashion and a surgical pause was performed.  Local anesthetic was infiltrated into the skin and subcutaneous tissues along the anterior portion of the deltoid muscle at the shoulder. An incision was made sharply and dissection down to subcutaneous tissue was performed. A lipoma was identified which tracked deep to the muscle. Tracing it through the muscle fibers showed that it arose down near the humerus. This was done with sparing electrocautery especially no electrocautery was used down near the humerus. A single bleeding vessel was clipped which resulted in good hemostasis at the humerus. The specimen was elevated and measured at 9 cm x 4 cm and appeared to be a typical lipoma. It was sent off for examination. Hemostasis was accomplished at this point there was no sign of bleeding no need for further electrocautery except in the subcutaneous tissues. No drain was utilized. The muscle fibers were brought together with figure-of-eight 30 Vicryls and then deep sutures of 3-0 Vicryl were placed in an intermediate fashion of closure for a 10 cm wound. Ultimately a 4-0 Monocryl was utilized in a running fashion and in interrupted fashion to  reapproximate the sword of his tattoo. Steri-Strips and Mastisol were utilized as was a sterile dressing..  Patient hour this procedure well there were no common locations sponge lap needle count was correct he was taken to recovery room in stable condition to be discharged care of his family and follow-up in 10 days   Florene Glen, MD FACS

## 2016-07-06 ENCOUNTER — Encounter: Payer: Self-pay | Admitting: Surgery

## 2016-07-06 LAB — SURGICAL PATHOLOGY

## 2016-07-16 ENCOUNTER — Other Ambulatory Visit: Payer: No Typology Code available for payment source

## 2016-07-17 ENCOUNTER — Telehealth: Payer: Self-pay | Admitting: Surgery

## 2016-07-17 NOTE — Telephone Encounter (Signed)
Pt called to cancel post op appt. Stated that he is out of town, and will be for awhile; but his wife has looked at the site a few times and everything looks good. There is no sign of infection, the steri-strips came off after 10 days. Pt states if there is any issues he will go to the [place] in [place].

## 2016-07-19 ENCOUNTER — Ambulatory Visit: Payer: No Typology Code available for payment source | Admitting: Surgery

## 2017-03-12 ENCOUNTER — Ambulatory Visit: Payer: Self-pay | Admitting: Allergy and Immunology

## 2017-05-07 ENCOUNTER — Ambulatory Visit (INDEPENDENT_AMBULATORY_CARE_PROVIDER_SITE_OTHER): Payer: Non-veteran care | Admitting: Allergy & Immunology

## 2017-05-07 ENCOUNTER — Encounter (INDEPENDENT_AMBULATORY_CARE_PROVIDER_SITE_OTHER): Payer: Self-pay

## 2017-05-07 ENCOUNTER — Encounter: Payer: Self-pay | Admitting: Allergy & Immunology

## 2017-05-07 VITALS — BP 116/76 | HR 74 | Temp 97.5°F | Resp 12 | Ht 72.84 in | Wt 262.2 lb

## 2017-05-07 DIAGNOSIS — J3089 Other allergic rhinitis: Secondary | ICD-10-CM | POA: Diagnosis not present

## 2017-05-07 DIAGNOSIS — J302 Other seasonal allergic rhinitis: Secondary | ICD-10-CM

## 2017-05-07 DIAGNOSIS — T781XXD Other adverse food reactions, not elsewhere classified, subsequent encounter: Secondary | ICD-10-CM

## 2017-05-07 MED ORDER — AZELASTINE HCL 0.1 % NA SOLN: 2.0000 | Freq: Two times a day (BID) | NASAL | 5 refills | Status: DC | PRN

## 2017-05-07 MED ORDER — FLUTICASONE PROPIONATE 50 MCG/ACT NA SUSP: 2.0000 | Freq: Every day | NASAL | 5 refills | Status: DC

## 2017-05-07 NOTE — Progress Notes (Signed)
NEW PATIENT  Date of Service/Encounter:  05/07/17  Referring provider: Beverly Milch, NP   Assessment:   Seasonal and perennial allergic rhinitis (horse, trees, weeds, grasses, molds, dust mites, cat, dog and cockroach)  Adverse food reaction (peanuts, scallops)   Plan/Recommendations:   1. Seasonal and perennial allergic rhinitis - Testing today showed: horse, trees, weeds, grasses, molds, dust mites, cat, dog and cockroach  - Avoidance measures provided. - Continue with Zyrtec (cetirizine) 10mg  tablet once daily - Start Flonase (fluticasone) two sprays per nostril daily and Astelin (azelastine) 2 sprays per nostril 1-2 times daily as needed - You can use an extra dose of the antihistamine, if needed, for breakthrough symptoms.  - Consider nasal saline rinses 1-2 times daily to remove allergens from the nasal cavities as well as help with mucous clearance (this is especially helpful to do before the nasal sprays are given) - Allergy immunotherapy Consent signed today. - Make an appointment in two weeks for the first injection.   2. Adverse food reaction - Testing was negative to scallops and peanut. - We will get blood testing to confirm this.  3. Return in about 3 months (around 08/07/2017).   Subjective:   Sean Cain is a 51 y.o. male presenting today for evaluation of  Chief Complaint  Patient presents with  . Allergies  . Nasal Congestion    Sean Cain has a history of the following: Patient Active Problem List   Diagnosis Date Noted  . Lipoma of left upper extremity   . 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 Shoulder mass 04/19/2016    History obtained from: chart review and patient.  Sean Cain was referred by Hyler, Gwen Her, NP ([facility])    Gastroenterologist ([place]): changes every 3-6 months  Rheumatologist ([place]) Psychiatrist ([facility] in the [place])  [place] is a 51 y.o. male  presenting for allergic rhinitis. He has had these symptoms his entire life. Symptoms consist mostly of congestion, sneezing, itchy watery eyes. Symptoms are worse in the spring and the fall. He is currently on Zyrtec 10 mg daily.  He is a nasal steroid (unknown name), which he uses as needed. He was on allergy shots until around 2008. He was getting them at [facility] through the [place]. Unfortunately, he was getting charged $50 at each shot visit. He was on maintenance for a period of 7-8 months he estimates. These shots did help tremendously. Coverage is better now because his disability rating increased; he is now 90% Service [employer], but he gets the 100% cover due to unemployability. He is getting disability for Crohn's disease, depression, and hernia from the small bowel resection (performed in 2011).   He is currently on Humira as well as MTX for his Crohn's. He has had symptoms of Crohn's for years, first flaring when he was in the [employer]. He was having marked blood loss at that time. He thinks that he had symptoms as a child as well. He does have arthritis as a complication of the Crohn's as well. He follows with both GI and Rheumatology in the [place].    He has no history of asthma or COPD. He does have throat and tongue itching from raw peanuts (peanut butter is fine, roasted in fine). He tolerates tree nuts, egg, and milk, and most seafood without a problem. He does have some intolerance of scallops, consisting of abdominal pain. He has a history of moles and see Dermatology  at the [facility] as well. Otherwise, there is no history of other atopic diseases, including drug allergies, stinging insect allergies, or urticaria. There is no significant infectious history. 2Vaccinations are up to date.    Past Medical History: Patient Active Problem List   Diagnosis Date Noted  . Lipoma of left upper extremity   . 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 Shoulder mass 04/19/2016    Medication List:  Allergies as  of 05/07/2017      Reactions   Remicade [infliximab] Anaphylaxis   Flonase [fluticasone Propionate] Hives   Singulair [montelukast Sodium]    Montelukast Other (See Comments)   Other Reaction: Other reaction      Medication List       Accurate as of 05/07/17 10:38 AM. Always use your most recent med list.          atorvastatin 40 MG tablet Commonly known as:  LIPITOR Take 40 mg by mouth at bedtime.   azelastine 0.1 % nasal spray Commonly known as:  ASTELIN Place 2 sprays into both nostrils 2 (two) times daily as needed for rhinitis.   buPROPion 150 MG 12 hr tablet Commonly known as:  ZYBAN Take 150-300 mg by mouth 2 (two) times daily. 300 mg in every morning, 150 mg at bedtime   CALCIUM 600-D 600-400 MG-UNIT Tabs Generic drug:  Calcium Carbonate-Vitamin D3 Take 1 tablet by mouth daily.   cetirizine 10 MG tablet Commonly known as:  ZYRTEC Take by mouth.   cyclobenzaprine 10 MG tablet Commonly known as:  FLEXERIL Take 10 mg by mouth at bedtime.   DULoxetine 60 MG capsule Commonly known as:  CYMBALTA Take 60 mg by mouth daily.   etodolac 400 MG tablet Commonly known as:  LODINE Take 400 mg by mouth 2 (two) times daily.   folic acid 1 MG tablet Commonly known as:  FOLVITE Take 1 mg by mouth daily.   HUMIRA PEN-CD/UC/HS STARTER 40 MG/0.8ML Pnkt Generic drug:  Adalimumab Inject 40 mg into the skin once a week.   hydrochlorothiazide 12.5 MG tablet Commonly known as:  HYDRODIURIL Take 12.5 mg by mouth daily.   HYDROcodone-acetaminophen 5-325 MG tablet Commonly known as:  NORCO/VICODIN Take 1 tablet by mouth every 4 (four) hours as needed for moderate pain.   lisinopril 20 MG tablet Commonly known as:  PRINIVIL,ZESTRIL Take 20 mg by mouth daily.   pregabalin 100 MG capsule Commonly known as:  LYRICA Take 100-200 mg by mouth 2 (two) times daily. 200 mg every morning, 100 mg at bedtime   tamsulosin 0.4 MG Caps capsule Commonly known as:  FLOMAX Take 0.4  mg by mouth.       Birth History: non-contributory.  Developmental History: non-contributory.   Past Surgical History: Past Surgical History:  Procedure Laterality Date  . KNEE CARTILAGE SURGERY  2002, 2005  . LAPAROSCOPIC SMALL BOWEL RESECTION  2011  . LIPOMA EXCISION Left 07/05/2016   Procedure: EXCISION LIPOMA;  Surgeon: Florene Glen, MD;  Location: [facility] ORS;  Service: General;  Laterality: Left;  . NASAL SINUS SURGERY  2006  . nissenfundoplication  0347     Family History: Family History  Problem Relation Age of Onset  . Alzheimer's disease Mother   . Heart disease Father   . Asthma Father   . Allergic rhinitis Neg Hx   . Angioedema Neg Hx   . Eczema Neg Hx   . Immunodeficiency Neg Hx   . Urticaria Neg Hx      Social  History: Sean Cain lives at home with his wife. His wife has one child and he has two children, all from previous relationships. He does have one grandson. Currently he is unemployed and obtains benefits from the [place]. He and his wife live in a home that is 74-82 years old. They have hardwood in the main living areas and the bedrooms. They have two dogs at home. There are no mildew or roach problems. He does have dust mite coverings on the bedding. There is no tobacco exposure. 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 This presented some complications due to his severe allergy symptoms.     Review of Systems: a 14-point review of systems is pertinent for what is mentioned in HPI.  Otherwise, all other systems were negative. Constitutional: negative other than that listed in the HPI Eyes: negative other than that listed in the HPI Ears, nose, mouth, throat, and face: negative other than that listed in the HPI Respiratory: negative other than that listed in the HPI Cardiovascular: negative other than that listed in the HPI Gastrointestinal: negative other than that listed in the HPI Genitourinary: negative other than  that listed in the HPI Integument: negative other than that listed in the HPI Hematologic: negative other than that listed in the HPI Musculoskeletal: negative other than that listed in the HPI Neurological: negative other than that listed in the HPI Allergy/Immunologic: negative other than that listed in the HPI    Objective:   Blood pressure 116/76, pulse 74, temperature (!) 97.5 F (36.4 C), temperature source Oral, resp. rate 12, height 6' 0.83" (1.85 m), weight 262 lb 3.2 oz (118.9 kg), SpO2 96 %. Body mass index is 34.75 kg/m.   Physical Exam:  General: Alert, interactive, in no acute distress. Obese male. Pleasant.  Eyes: No conjunctival injection present on the right, No conjunctival injection present on the left, PERRL bilaterally, No discharge on the right, No discharge on the left, No Horner-Trantas dots present and allergic shiners present bilaterally Ears: Right TM pearly gray with normal light reflex, Left TM pearly gray with normal light reflex, Right TM intact without perforation and Left TM intact without perforation.  Nose/Throat: External nose within normal limits and septum midline, turbinates edematous and pale with clear discharge, post-pharynx erythematous with cobblestoning in the posterior oropharynx. Tonsils 2+ without exudates Neck: Supple without thyromegaly.  Adenopathy: Shoddy bilateral anterior cervical lymphadenopathy. and No enlarged lymph nodes appreciated in the occipital, axillary, epitrochlear, inguinal, or popliteal regions. Lungs: Clear to auscultation without wheezing, rhonchi or rales. No increased work of breathing. CV: Normal S1/S2, no murmurs. Capillary refill <2 seconds.  Abdomen: Nondistended, nontender. No guarding or rebound tenderness. Bowel sounds present in all fields and hypoactive. Hernia present.  Skin: Warm and dry, without lesions or rashes. Extremities:  No clubbing, cyanosis or edema. Neuro:   Grossly intact. No focal deficits  appreciated. Responsive to questions.  Diagnostic studies:   Allergy Studies:   Indoor/Outdoor Percutaneous Adult Environmental Panel: positive to bahia grass, [place] blue grass, perennial rye grass, timothy grass, common mugwort, ash, birch, American beech, pecan pollen, pine, horse, cockroach and mouse. Otherwise negative with adequate controls.  Indoor/Outdoor Selected Intradermal Environmental Panel: positive to [place] grass, ragweed mix, mold mix #1, mold mix #2, mold mix #3, mold mix #4, cat, dog and mite mix. Otherwise negative with adequate controls.  Selected Foods Panel: negative to peanut and scallop with adequate controls     Fara Olden  Ernst Bowler, MD [facility]

## 2017-05-07 NOTE — Patient Instructions (Addendum)
1. Seasonal and perennial allergic rhinitis - Testing today showed: horse, trees, weeds, grasses, molds, dust mites, cat, dog and cockroach - Avoidance measures provided. - Continue with Zyrtec (cetirizine) 10mg  tablet once daily - Start Flonase (fluticasone) two sprays per nostril daily and Astelin (azelastine) 2 sprays per nostril 1-2 times daily as needed - You can use an extra dose of the antihistamine, if needed, for breakthrough symptoms.  - Consider nasal saline rinses 1-2 times daily to remove allergens from the nasal cavities as well as help with mucous clearance (this is especially helpful to do before the nasal sprays are given) - Allergy immunotherapy Consent signed today. - Make an appointment in two weeks for the first injection.   2. Adverse food reaction - Testing was negative to scallops and peanut. - We will get blood testing to confirm this.  3. Return in about 3 months (around 08/07/2017).   Please inform us of any Emergency Department visits, hospitalizations, or changes in symptoms. Call us before going to the ED for breathing or allergy symptoms since we might be able to fit you in for a sick visit. Feel free to contact us anytime with any questions, problems, or concerns.  It was a pleasure to meet you today! Enjoy the rest of your summer!   Websites that have reliable patient information: 1. American Academy of Asthma, Allergy, and Immunology: www.aaaai.org 2. Food Allergy Research and Education (FARE): foodallergy.org 3. Mothers of Asthmatics: http://www.asthmacommunitynetwork.org 4. American College of Allergy, Asthma, and Immunology: www.acaai.org   Election Day is coming up on Tuesday, November 6th! Make your voice heard! Register to vote at [email or web address]!     Reducing Pollen Exposure  The American Academy of Allergy, Asthma and Immunology suggests the following steps to reduce your exposure to pollen during allergy seasons.    1. Do not hang sheets or  clothing out to dry; pollen may collect on these items. 2. Do not mow lawns or spend time around freshly cut grass; mowing stirs up pollen. 3. Keep windows closed at night.  Keep car windows closed while driving. 4. Minimize morning activities outdoors, a time when pollen counts are usually at their highest. 5. Stay indoors as much as possible when pollen counts or humidity is high and on windy days when pollen tends to remain in the air longer. 6. Use air conditioning when possible.  Many air conditioners have filters that trap the pollen spores. 7. Use a HEPA room air filter to remove pollen form the indoor air you breathe.  Control of Cockroach Allergen  Cockroach allergen has been identified as an important cause of acute attacks of asthma, especially in urban settings.  There are fifty-five species of cockroach that exist in the [place], however only three, the [place], [place] species produce allergen that can affect patients with Asthma.  Allergens can be obtained from fecal particles, egg casings and secretions from cockroaches.    1. Remove food sources. 2. Reduce access to water. 3. Seal access and entry points. 4. Spray runways with 0.5-1% Diazinon or Chlorpyrifos 5. Blow boric acid power under stoves and refrigerator. Place bait stations (hydramethylnon) at feeding sites.  Control of Mold Allergen  Mold and fungi can grow on a variety of surfaces provided certain temperature and moisture conditions exist.  Outdoor molds grow on plants, decaying vegetation and soil.  The major outdoor mold, Alternaria and Cladosporium, are found in very high numbers during hot and dry conditions.  Generally, a late Summer -  Fall peak is seen for common outdoor fungal spores.  Rain will temporarily lower outdoor mold spore count, but counts rise rapidly when the rainy period ends.  The most important indoor molds are Aspergillus and Penicillium.  Dark, humid and poorly ventilated  basements are ideal sites for mold growth.  The next most common sites of mold growth are the bathroom and the kitchen.  Outdoor [employer] 1. Use air conditioning and keep windows closed 2. Avoid exposure to decaying vegetation. 3. Avoid leaf raking. 4. Avoid grain handling. 5. Consider wearing a face mask if working in moldy areas.  Indoor Mold Control 1. Maintain humidity below 50%. 2. Clean washable surfaces with 5% bleach solution. 3. Remove sources e.g. contaminated carpets.  Control of House Dust Mite Allergen    House dust mites play a major role in allergic asthma and rhinitis.  They occur in environments with high humidity wherever human skin, the food for dust mites is found. High levels have been detected in dust obtained from mattresses, pillows, carpets, upholstered furniture, bed covers, clothes and soft toys.  The principal allergen of the house dust mite is found in its feces.  A gram of dust may contain 1,000 mites and 250,000 fecal particles.  Mite antigen is easily measured in the air during house cleaning activities.    1. Encase mattresses, including the box spring, and pillow, in an air tight cover.  Seal the zipper end of the encased mattresses with wide adhesive tape. 2. Wash the bedding in water of 130 degrees Farenheit weekly.  Avoid cotton comforters/quilts and flannel bedding: the most ideal bed covering is the dacron comforter. 3. Remove all upholstered furniture from the bedroom. 4. Remove carpets, carpet padding, rugs, and non-washable window drapes from the bedroom.  Wash drapes weekly or use plastic window coverings. 5. Remove all non-washable stuffed toys from the bedroom.  Wash stuffed toys weekly. 6. Have the room cleaned frequently with a vacuum cleaner and a damp dust-mop.  The patient should not be in a room which is being cleaned and should wait 1 hour after cleaning before going into the room. 7. Close and seal all heating outlets in the bedroom.   Otherwise, the room will become filled with dust-laden air.  An electric heater can be used to heat the room. 8. Reduce indoor humidity to less than 50%.  Do not use a humidifier.  Control of Dog or Cat Allergen  Avoidance is the best way to manage a dog or cat allergy. If you have a dog or cat and are allergic to dog or cats, consider removing the dog or cat from the home. If you have a dog or cat but don't want to find it a new home, or if your family wants a pet even though someone in the household is allergic, here are some strategies that may help keep symptoms at bay:  1. Keep the pet out of your bedroom and restrict it to only a few rooms. Be advised that keeping the dog or cat in only one room will not limit the allergens to that room. 2. Don't pet, hug or kiss the dog or cat; if you do, wash your hands with soap and water. 3. High-efficiency particulate air (HEPA) cleaners run continuously in a bedroom or living room can reduce allergen levels over time. 4. Regular use of a high-efficiency vacuum cleaner or a central vacuum can reduce allergen levels. 5. Giving your dog or cat a bath at least  once a week can reduce airborne allergen.

## 2017-05-08 NOTE — Addendum Note (Signed)
Addended by: Valentina Shaggy on: 05/08/2017 09:55 PM   Modules accepted: Orders

## 2017-05-13 NOTE — Progress Notes (Signed)
Vials exp 05-14-18

## 2017-05-14 DIAGNOSIS — J301 Allergic rhinitis due to pollen: Secondary | ICD-10-CM | POA: Diagnosis not present

## 2017-05-15 DIAGNOSIS — J3089 Other allergic rhinitis: Secondary | ICD-10-CM | POA: Diagnosis not present

## 2017-05-28 ENCOUNTER — Ambulatory Visit (INDEPENDENT_AMBULATORY_CARE_PROVIDER_SITE_OTHER): Payer: Non-veteran care | Admitting: *Deleted

## 2017-05-28 DIAGNOSIS — J309 Allergic rhinitis, unspecified: Secondary | ICD-10-CM

## 2017-05-28 NOTE — Progress Notes (Signed)
Immunotherapy   Patient Details  Name: Sean Cain MRN: [id number] Date of Birth: [birth year]  05/28/2017  [name] : Patient started ITX.  Grass/Weed/Tree/Cat/Dog and Mold/CR/DM.  Blue Vial 1:1000,000 and 0.05 ml of each vial given. Following schedule: B  Frequency: 1-2 times a week. Epi-Pen: Patient does have Epipen and has been instructed on proper use. Consent signed and patient instructions given. Patient waited 30 minutes after allergy injections and no issues or reaction noted.   Maree Erie 05/28/2017, 11:16 AM

## 2017-06-04 ENCOUNTER — Ambulatory Visit (INDEPENDENT_AMBULATORY_CARE_PROVIDER_SITE_OTHER): Payer: Non-veteran care | Admitting: *Deleted

## 2017-06-04 DIAGNOSIS — J309 Allergic rhinitis, unspecified: Secondary | ICD-10-CM

## 2017-06-11 ENCOUNTER — Ambulatory Visit (INDEPENDENT_AMBULATORY_CARE_PROVIDER_SITE_OTHER): Payer: Non-veteran care | Admitting: *Deleted

## 2017-06-11 DIAGNOSIS — J309 Allergic rhinitis, unspecified: Secondary | ICD-10-CM | POA: Diagnosis not present

## 2017-06-18 ENCOUNTER — Ambulatory Visit (INDEPENDENT_AMBULATORY_CARE_PROVIDER_SITE_OTHER): Payer: Non-veteran care

## 2017-06-18 DIAGNOSIS — J309 Allergic rhinitis, unspecified: Secondary | ICD-10-CM

## 2017-06-25 ENCOUNTER — Ambulatory Visit (INDEPENDENT_AMBULATORY_CARE_PROVIDER_SITE_OTHER): Payer: Non-veteran care

## 2017-06-25 DIAGNOSIS — J309 Allergic rhinitis, unspecified: Secondary | ICD-10-CM

## 2017-07-02 ENCOUNTER — Ambulatory Visit (INDEPENDENT_AMBULATORY_CARE_PROVIDER_SITE_OTHER): Payer: Non-veteran care | Admitting: *Deleted

## 2017-07-02 DIAGNOSIS — J309 Allergic rhinitis, unspecified: Secondary | ICD-10-CM | POA: Diagnosis not present

## 2017-07-09 ENCOUNTER — Ambulatory Visit (INDEPENDENT_AMBULATORY_CARE_PROVIDER_SITE_OTHER): Payer: Non-veteran care

## 2017-07-09 DIAGNOSIS — J309 Allergic rhinitis, unspecified: Secondary | ICD-10-CM | POA: Diagnosis not present

## 2017-07-16 ENCOUNTER — Ambulatory Visit (INDEPENDENT_AMBULATORY_CARE_PROVIDER_SITE_OTHER): Payer: Non-veteran care | Admitting: *Deleted

## 2017-07-16 DIAGNOSIS — J309 Allergic rhinitis, unspecified: Secondary | ICD-10-CM | POA: Diagnosis not present

## 2017-07-30 ENCOUNTER — Ambulatory Visit (INDEPENDENT_AMBULATORY_CARE_PROVIDER_SITE_OTHER): Payer: Non-veteran care | Admitting: *Deleted

## 2017-07-30 DIAGNOSIS — J309 Allergic rhinitis, unspecified: Secondary | ICD-10-CM

## 2017-08-06 ENCOUNTER — Ambulatory Visit (INDEPENDENT_AMBULATORY_CARE_PROVIDER_SITE_OTHER): Payer: Non-veteran care

## 2017-08-06 DIAGNOSIS — J309 Allergic rhinitis, unspecified: Secondary | ICD-10-CM

## 2017-08-13 ENCOUNTER — Encounter: Payer: Self-pay | Admitting: Allergy & Immunology

## 2017-08-13 ENCOUNTER — Ambulatory Visit (INDEPENDENT_AMBULATORY_CARE_PROVIDER_SITE_OTHER): Payer: Non-veteran care | Admitting: Allergy & Immunology

## 2017-08-13 VITALS — BP 124/70 | HR 80 | Temp 98.4°F | Resp 20

## 2017-08-13 DIAGNOSIS — J309 Allergic rhinitis, unspecified: Secondary | ICD-10-CM | POA: Insufficient documentation

## 2017-08-13 DIAGNOSIS — K50919 Crohn's disease, unspecified, with unspecified complications: Secondary | ICD-10-CM | POA: Diagnosis not present

## 2017-08-13 DIAGNOSIS — T781XXA Other adverse food reactions, not elsewhere classified, initial encounter: Secondary | ICD-10-CM | POA: Insufficient documentation

## 2017-08-13 DIAGNOSIS — T781XXD Other adverse food reactions, not elsewhere classified, subsequent encounter: Secondary | ICD-10-CM

## 2017-08-13 DIAGNOSIS — J302 Other seasonal allergic rhinitis: Secondary | ICD-10-CM

## 2017-08-13 DIAGNOSIS — K5090 Crohn's disease, unspecified, without complications: Secondary | ICD-10-CM | POA: Insufficient documentation

## 2017-08-13 DIAGNOSIS — J3089 Other allergic rhinitis: Secondary | ICD-10-CM | POA: Diagnosis not present

## 2017-08-13 NOTE — Patient Instructions (Addendum)
1. Seasonal and perennial allergic rhinitis (horse, trees, weeds, grasses, molds, dust mites, cat, dog and cockroach) - Continue with Zyrtec (cetirizine) 10mg  tablet once daily, Flonase (fluticasone) two sprays per nostril daily and Astelin (azelastine) 2 sprays per nostril 1-2 times daily as needed - You can use an extra dose of the antihistamine, if needed, for breakthrough symptoms.  - Continue with allergy shots at the same schedule.  2. Adverse food reaction - Testing was negative to scallops and peanut. - Be sure to get those labs so that we can confirm that the foods are negative.   3. Return in about 1 year (around 08/13/2018).  Please inform us of any Emergency Department visits, hospitalizations, or changes in symptoms. Call us before going to the ED for breathing or allergy symptoms since we might be able to fit you in for a sick visit. Feel free to contact us anytime with any questions, problems, or concerns.  It was a pleasure to see you again today! Enjoy the fall season!  Websites that have reliable patient information: 1. American Academy of Asthma, Allergy, and Immunology: www.aaaai.org 2. Food Allergy Research and Education (FARE): foodallergy.org 3. Mothers of Asthmatics: http://www.asthmacommunitynetwork.org 4. American College of Allergy, Asthma, and Immunology: www.acaai.org

## 2017-08-13 NOTE — Progress Notes (Signed)
FOLLOW UP  Date of Service/Encounter:  08/13/17   Assessment:   Seasonal and perennial allergic rhinitis (horse, trees, weeds, grasses, molds, dust mites, cat, dog and cockroach)  Adverse food reaction (peanuts, scallops) - with negative skin testing  Crohn's disease - on MTX + Humira + prednisone 10mg   Plan/Recommendations:   1. Seasonal and perennial allergic rhinitis (horse, trees, weeds, grasses, molds, dust mites, cat, dog and cockroach) - Continue with Zyrtec (cetirizine) 10mg  tablet once daily, Flonase (fluticasone) two sprays per nostril daily and Astelin (azelastine) 2 sprays per nostril 1-2 times daily as needed - You can use an extra dose of the antihistamine, if needed, for breakthrough symptoms.  - Continue with allergy shots at the same schedule.  2. Adverse food reaction - Testing was negative to scallops and peanut. - Be sure to get those labs so that we can confirm that the foods are negative.   3. Return in about 1 year (around 08/13/2018).   Subjective:   Sean Cain is a 51 y.o. male presenting today for follow up of  Chief Complaint  Patient presents with  . Allergic Rhinitis     follow up    Sean Cain has a history of the following: Patient Active Problem List   Diagnosis Date Noted  . Allergic rhinitis 08/13/2017  . Lipoma of left upper extremity   . 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 Shoulder mass 04/19/2016    History obtained from: chart review and patient.  Sean Cain's Primary Care Provider is Hyler, Gwen Her, NP.     Sean Cain is a 51 y.o. male presenting for a follow up visit. He was last seen in August 2018 for his initial visit. At that time, he had testing that demonstrated sensitizations to horse, trees, weeds, grasses, molds, dust mites, cat, dog, and cockroach. He also had a history of reactions to peanuts and scallops, but skin testing was negative. We did order blood work to clarify this, but it does  not appear that this has been collected. He did start allergy shots and has been compliant with this.   Since the last visit, Sean Cain has done very well. He has remained compliant with the injections, missing only one week. He continues to take cetirizine 10mg  on a daily basis, but has made his nasal sprays only as needed. Injections have gone well without adverse event. He does endorse some pruritis after each injection, but this never becomes intolerable and has not affected his advance.   Sean Cain is on allergen immunotherapy. He receives two injections. Immunotherapy script #1 contains trees, weeds, grasses, cat and dog. He currently receives 0.90mL of the GOLD vial (1/10,000). Immunotherapy script #2 contains molds, dust mites and cockroach. He currently receives 0.57mL of the GOLD vial (1/10,000). He started shots September of 2018 and not yet reached maintenance. Quality of life on the injections has markedly improved.   He continues to avoid peanuts and scallops. He has not gotten the labs collected from the last visit because they were not "done at the [place]". He recently had prednisone added to his regimen to help with control of his Crohn's disease. He is already on Humira as well as MTX, but the addition of the prednisone has provided much more relief. He is unsure whether he will remain on this for a prolonged period of time. Otherwise, there have been no changes to his past medical history, surgical history, family history, or social history.    Review of  Systems: a 14-point review of systems is pertinent for what is mentioned in HPI.  Otherwise, all other systems were negative. Constitutional: negative other than that listed in the HPI Eyes: negative other than that listed in the HPI Ears, nose, mouth, throat, and face: negative other than that listed in the HPI Respiratory: negative other than that listed in the HPI Cardiovascular: negative other than that listed in the  HPI Gastrointestinal: negative other than that listed in the HPI Genitourinary: negative other than that listed in the HPI Integument: negative other than that listed in the HPI Hematologic: negative other than that listed in the HPI Musculoskeletal: negative other than that listed in the HPI Neurological: negative other than that listed in the HPI Allergy/Immunologic: negative other than that listed in the HPI    Objective:   Blood pressure 124/70, pulse 80, temperature 98.4 F (36.9 C), temperature source Oral, resp. rate 20. There is no height or weight on file to calculate BMI.   Physical Exam:  General: Alert, interactive, in no acute distress. Pleasant interactive male.  Eyes: No conjunctival injection bilaterally, no discharge on the right, no discharge on the left and no Horner-Trantas dots present. PERRL bilaterally. EOMI without pain. No photophobia.  Ears: Right TM pearly gray with normal light reflex, Left TM pearly gray with normal light reflex, Right TM intact without perforation and Left TM intact without perforation.  Nose/Throat: External nose within normal limits and septum midline. Turbinates edematous and pale with clear discharge. Posterior oropharynx mildly erythematous without cobblestoning in the posterior oropharynx. Tonsils 2+ without exudates.  Tongue without thrush. Adenopathy: no enlarged lymph nodes appreciated in the anterior cervical, occipital, axillary, epitrochlear, inguinal, or popliteal regions. Lungs: Clear to auscultation without wheezing, rhonchi or rales. No increased work of breathing. CV: Normal S1/S2. No murmurs. Capillary refill <2 seconds.  Skin: Warm and dry, without lesions or rashes. Neuro:   Grossly intact. No focal deficits appreciated. Responsive to questions.  Diagnostic studies: none      Salvatore Marvel, MD [place] [facility] [place]

## 2017-08-20 ENCOUNTER — Ambulatory Visit (INDEPENDENT_AMBULATORY_CARE_PROVIDER_SITE_OTHER): Payer: Non-veteran care

## 2017-08-20 DIAGNOSIS — J309 Allergic rhinitis, unspecified: Secondary | ICD-10-CM | POA: Diagnosis not present

## 2017-09-24 ENCOUNTER — Ambulatory Visit (INDEPENDENT_AMBULATORY_CARE_PROVIDER_SITE_OTHER): Payer: Non-veteran care | Admitting: *Deleted

## 2017-09-24 DIAGNOSIS — J309 Allergic rhinitis, unspecified: Secondary | ICD-10-CM

## 2017-10-01 ENCOUNTER — Ambulatory Visit (INDEPENDENT_AMBULATORY_CARE_PROVIDER_SITE_OTHER): Payer: Non-veteran care

## 2017-10-01 DIAGNOSIS — J309 Allergic rhinitis, unspecified: Secondary | ICD-10-CM | POA: Diagnosis not present

## 2017-10-06 DIAGNOSIS — J3089 Other allergic rhinitis: Secondary | ICD-10-CM | POA: Diagnosis not present

## 2017-10-07 DIAGNOSIS — J301 Allergic rhinitis due to pollen: Secondary | ICD-10-CM | POA: Diagnosis not present

## 2017-10-08 ENCOUNTER — Ambulatory Visit (INDEPENDENT_AMBULATORY_CARE_PROVIDER_SITE_OTHER): Payer: Non-veteran care

## 2017-10-08 DIAGNOSIS — J309 Allergic rhinitis, unspecified: Secondary | ICD-10-CM

## 2017-10-22 ENCOUNTER — Ambulatory Visit (INDEPENDENT_AMBULATORY_CARE_PROVIDER_SITE_OTHER): Payer: Non-veteran care

## 2017-10-22 DIAGNOSIS — J309 Allergic rhinitis, unspecified: Secondary | ICD-10-CM | POA: Diagnosis not present

## 2017-11-05 ENCOUNTER — Ambulatory Visit (INDEPENDENT_AMBULATORY_CARE_PROVIDER_SITE_OTHER): Payer: Non-veteran care | Admitting: *Deleted

## 2017-11-05 DIAGNOSIS — J309 Allergic rhinitis, unspecified: Secondary | ICD-10-CM | POA: Diagnosis not present

## 2017-11-12 ENCOUNTER — Ambulatory Visit (INDEPENDENT_AMBULATORY_CARE_PROVIDER_SITE_OTHER): Payer: Non-veteran care

## 2017-11-12 DIAGNOSIS — J309 Allergic rhinitis, unspecified: Secondary | ICD-10-CM | POA: Diagnosis not present

## 2017-11-19 ENCOUNTER — Ambulatory Visit (INDEPENDENT_AMBULATORY_CARE_PROVIDER_SITE_OTHER): Payer: Non-veteran care | Admitting: *Deleted

## 2017-11-19 DIAGNOSIS — J309 Allergic rhinitis, unspecified: Secondary | ICD-10-CM

## 2017-11-22 ENCOUNTER — Telehealth: Payer: Self-pay | Admitting: Allergy & Immunology

## 2017-11-22 DIAGNOSIS — T7840XD Allergy, unspecified, subsequent encounter: Secondary | ICD-10-CM

## 2017-11-22 NOTE — Telephone Encounter (Signed)
I did have some labs ordered in the system (scallop and peanut IgE). He could get these drawn. I would also get a serum tryptase.  Salvatore Marvel, MD [facility] and [place] [facility] [place]

## 2017-11-22 NOTE — Telephone Encounter (Signed)
Called and informed patient and wife of these labs. She stated that they have been discharged and did have labs drawn. Wife is going to call the [place] hospital and see about getting these labs drawn. She will call back if there is any problem.

## 2017-11-22 NOTE — Telephone Encounter (Signed)
Patients wife called to inform you that Sean Cain was admitted to the [facility], 7th floor, last night for an anaphylactic reaction, to what they believe was an IT consultant from a craft spray. He was able to use an Epi Pen from 2015. They have given him benedryl and other meds. Di Kindle (wife) wants to know if there is anything specific she needs to ask the doctor.

## 2017-11-26 NOTE — Telephone Encounter (Signed)
Printed off lab requisitions. Also printed off his allergy list from the last visit and will have [facility] email them to the patient.  Salvatore Marvel, MD Allergy and [place] [facility] [place]

## 2017-11-26 NOTE — Telephone Encounter (Signed)
Patient and wife called this morning to see if they could get the orders for the labs, they went to the [place] and did not have the order. I see the the scallop and peanut IgE but not the tryptase. He also called to see if he can get his shot today since his reaction. Beth informed him to wait till next Tuesday. He also would like a copy of what he is allergic to emailed to him and also the labs emailed to him. [email or web address].   Please Advise

## 2017-11-26 NOTE — Addendum Note (Signed)
Addended by: Valentina Shaggy on: 11/26/2017 10:36 AM   Modules accepted: Orders

## 2017-11-28 NOTE — Telephone Encounter (Signed)
FYI

## 2017-12-03 ENCOUNTER — Telehealth: Payer: Self-pay

## 2017-12-03 ENCOUNTER — Ambulatory Visit (INDEPENDENT_AMBULATORY_CARE_PROVIDER_SITE_OTHER): Payer: Non-veteran care

## 2017-12-03 DIAGNOSIS — J309 Allergic rhinitis, unspecified: Secondary | ICD-10-CM

## 2017-12-03 NOTE — Addendum Note (Signed)
Addended by: Valentina Shaggy on: 12/03/2017 01:34 PM   Modules accepted: Orders

## 2017-12-03 NOTE — Telephone Encounter (Signed)
Patient stopped by to see if Dr Ernst Bowler could send in labs for: Goats,Cows,Chicken,Deer, Rabbits, [employer].   Also can it be emailed again.  Please Advise

## 2017-12-03 NOTE — Telephone Encounter (Signed)
Printed off new requests: rabbit, cow, chicken, and goat.  Deer is not available, but I anticipate that it cross reacts with cow. Bearded dragon has no dander to leave to an allergic reaction.  Please send Sean Cain to [facility] since they were the only ones that offered goat. Therefore I ordered them all under [facility].  Salvatore Marvel, MD All[facility] and [place] [facility]

## 2017-12-03 NOTE — Progress Notes (Signed)
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 He stated that the reaction occurred 11-21-17 and he was advised per Dr. Ernst Bowler to skip last weeks injection. I did let the patient know that we would be repeating his dosing from the last injection just to be safe and because he was a couple of weeks late. Patient was agreeable with this and is waiting the 30 minutes as required by our immunotherapy protocol.

## 2017-12-06 NOTE — Telephone Encounter (Signed)
Labs have been sent to patient per Dr. Ernst Bowler

## 2017-12-10 ENCOUNTER — Ambulatory Visit (INDEPENDENT_AMBULATORY_CARE_PROVIDER_SITE_OTHER): Payer: Non-veteran care | Admitting: *Deleted

## 2017-12-10 DIAGNOSIS — J309 Allergic rhinitis, unspecified: Secondary | ICD-10-CM | POA: Diagnosis not present

## 2017-12-17 ENCOUNTER — Ambulatory Visit (INDEPENDENT_AMBULATORY_CARE_PROVIDER_SITE_OTHER): Payer: Non-veteran care

## 2017-12-17 DIAGNOSIS — J309 Allergic rhinitis, unspecified: Secondary | ICD-10-CM

## 2017-12-24 ENCOUNTER — Ambulatory Visit (INDEPENDENT_AMBULATORY_CARE_PROVIDER_SITE_OTHER): Payer: Non-veteran care | Admitting: *Deleted

## 2017-12-24 DIAGNOSIS — J309 Allergic rhinitis, unspecified: Secondary | ICD-10-CM

## 2017-12-25 DIAGNOSIS — J301 Allergic rhinitis due to pollen: Secondary | ICD-10-CM | POA: Diagnosis not present

## 2017-12-26 DIAGNOSIS — J3089 Other allergic rhinitis: Secondary | ICD-10-CM | POA: Diagnosis not present

## 2018-01-14 ENCOUNTER — Ambulatory Visit (INDEPENDENT_AMBULATORY_CARE_PROVIDER_SITE_OTHER): Payer: Non-veteran care

## 2018-01-14 DIAGNOSIS — J309 Allergic rhinitis, unspecified: Secondary | ICD-10-CM | POA: Diagnosis not present

## 2018-01-21 ENCOUNTER — Ambulatory Visit (INDEPENDENT_AMBULATORY_CARE_PROVIDER_SITE_OTHER): Payer: Non-veteran care | Admitting: *Deleted

## 2018-01-21 DIAGNOSIS — J309 Allergic rhinitis, unspecified: Secondary | ICD-10-CM | POA: Diagnosis not present

## 2018-01-28 ENCOUNTER — Ambulatory Visit (INDEPENDENT_AMBULATORY_CARE_PROVIDER_SITE_OTHER): Payer: Non-veteran care | Admitting: *Deleted

## 2018-01-28 DIAGNOSIS — J309 Allergic rhinitis, unspecified: Secondary | ICD-10-CM | POA: Diagnosis not present

## 2018-02-04 ENCOUNTER — Ambulatory Visit (INDEPENDENT_AMBULATORY_CARE_PROVIDER_SITE_OTHER): Payer: Non-veteran care

## 2018-02-04 DIAGNOSIS — J309 Allergic rhinitis, unspecified: Secondary | ICD-10-CM

## 2018-02-11 ENCOUNTER — Ambulatory Visit (INDEPENDENT_AMBULATORY_CARE_PROVIDER_SITE_OTHER): Payer: Non-veteran care | Admitting: *Deleted

## 2018-02-11 DIAGNOSIS — J309 Allergic rhinitis, unspecified: Secondary | ICD-10-CM

## 2018-02-25 ENCOUNTER — Ambulatory Visit (INDEPENDENT_AMBULATORY_CARE_PROVIDER_SITE_OTHER): Payer: Non-veteran care

## 2018-02-25 DIAGNOSIS — J309 Allergic rhinitis, unspecified: Secondary | ICD-10-CM | POA: Diagnosis not present

## 2018-03-04 ENCOUNTER — Ambulatory Visit (INDEPENDENT_AMBULATORY_CARE_PROVIDER_SITE_OTHER): Payer: Non-veteran care | Admitting: *Deleted

## 2018-03-04 DIAGNOSIS — J309 Allergic rhinitis, unspecified: Secondary | ICD-10-CM

## 2018-03-11 ENCOUNTER — Ambulatory Visit (INDEPENDENT_AMBULATORY_CARE_PROVIDER_SITE_OTHER): Payer: Non-veteran care

## 2018-03-11 DIAGNOSIS — J309 Allergic rhinitis, unspecified: Secondary | ICD-10-CM

## 2018-04-08 ENCOUNTER — Ambulatory Visit (INDEPENDENT_AMBULATORY_CARE_PROVIDER_SITE_OTHER): Payer: Non-veteran care | Admitting: *Deleted

## 2018-04-08 DIAGNOSIS — J309 Allergic rhinitis, unspecified: Secondary | ICD-10-CM

## 2018-04-09 NOTE — Progress Notes (Signed)
VIALS EXP 04-11-19 

## 2018-04-10 DIAGNOSIS — J301 Allergic rhinitis due to pollen: Secondary | ICD-10-CM | POA: Diagnosis not present

## 2018-04-11 DIAGNOSIS — J3089 Other allergic rhinitis: Secondary | ICD-10-CM | POA: Diagnosis not present

## 2018-04-15 ENCOUNTER — Ambulatory Visit (INDEPENDENT_AMBULATORY_CARE_PROVIDER_SITE_OTHER): Payer: Non-veteran care

## 2018-04-15 DIAGNOSIS — J309 Allergic rhinitis, unspecified: Secondary | ICD-10-CM | POA: Diagnosis not present

## 2018-04-22 ENCOUNTER — Ambulatory Visit (INDEPENDENT_AMBULATORY_CARE_PROVIDER_SITE_OTHER): Payer: Non-veteran care | Admitting: *Deleted

## 2018-04-22 DIAGNOSIS — J309 Allergic rhinitis, unspecified: Secondary | ICD-10-CM

## 2018-04-24 ENCOUNTER — Telehealth: Payer: Self-pay

## 2018-04-24 NOTE — Telephone Encounter (Signed)
I have faxed a new referral request to [facility] for more office visits and allergy injections.

## 2018-05-06 ENCOUNTER — Ambulatory Visit (INDEPENDENT_AMBULATORY_CARE_PROVIDER_SITE_OTHER): Payer: PRIVATE HEALTH INSURANCE

## 2018-05-06 DIAGNOSIS — J309 Allergic rhinitis, unspecified: Secondary | ICD-10-CM

## 2018-05-07 NOTE — Telephone Encounter (Signed)
Received auth from va for more visits and allergy injections. Referring:: Elisabeth Most Ref # :: [id number] DOS:: 05/01/2018 - 04/25/2019 # of Vis:: [phone]

## 2018-05-13 ENCOUNTER — Ambulatory Visit (INDEPENDENT_AMBULATORY_CARE_PROVIDER_SITE_OTHER): Payer: PRIVATE HEALTH INSURANCE | Admitting: *Deleted

## 2018-05-13 DIAGNOSIS — J309 Allergic rhinitis, unspecified: Secondary | ICD-10-CM | POA: Diagnosis not present

## 2018-05-20 ENCOUNTER — Ambulatory Visit (INDEPENDENT_AMBULATORY_CARE_PROVIDER_SITE_OTHER): Payer: PRIVATE HEALTH INSURANCE

## 2018-05-20 DIAGNOSIS — J309 Allergic rhinitis, unspecified: Secondary | ICD-10-CM | POA: Diagnosis not present

## 2018-06-10 ENCOUNTER — Ambulatory Visit (INDEPENDENT_AMBULATORY_CARE_PROVIDER_SITE_OTHER): Payer: PRIVATE HEALTH INSURANCE

## 2018-06-10 DIAGNOSIS — J309 Allergic rhinitis, unspecified: Secondary | ICD-10-CM | POA: Diagnosis not present

## 2018-06-17 ENCOUNTER — Ambulatory Visit (INDEPENDENT_AMBULATORY_CARE_PROVIDER_SITE_OTHER): Payer: PRIVATE HEALTH INSURANCE | Admitting: *Deleted

## 2018-06-17 DIAGNOSIS — J309 Allergic rhinitis, unspecified: Secondary | ICD-10-CM

## 2018-07-09 ENCOUNTER — Ambulatory Visit (INDEPENDENT_AMBULATORY_CARE_PROVIDER_SITE_OTHER): Payer: PRIVATE HEALTH INSURANCE

## 2018-07-09 DIAGNOSIS — J309 Allergic rhinitis, unspecified: Secondary | ICD-10-CM

## 2018-08-22 ENCOUNTER — Encounter: Payer: Self-pay | Admitting: *Deleted

## 2018-08-27 ENCOUNTER — Ambulatory Visit (INDEPENDENT_AMBULATORY_CARE_PROVIDER_SITE_OTHER): Payer: PRIVATE HEALTH INSURANCE | Admitting: *Deleted

## 2018-08-27 DIAGNOSIS — J309 Allergic rhinitis, unspecified: Secondary | ICD-10-CM | POA: Diagnosis not present

## 2018-09-24 ENCOUNTER — Ambulatory Visit (INDEPENDENT_AMBULATORY_CARE_PROVIDER_SITE_OTHER): Payer: PRIVATE HEALTH INSURANCE | Admitting: *Deleted

## 2018-09-24 DIAGNOSIS — J309 Allergic rhinitis, unspecified: Secondary | ICD-10-CM | POA: Diagnosis not present

## 2018-10-03 ENCOUNTER — Ambulatory Visit (INDEPENDENT_AMBULATORY_CARE_PROVIDER_SITE_OTHER): Payer: PRIVATE HEALTH INSURANCE

## 2018-10-03 DIAGNOSIS — J309 Allergic rhinitis, unspecified: Secondary | ICD-10-CM

## 2018-10-10 ENCOUNTER — Ambulatory Visit (INDEPENDENT_AMBULATORY_CARE_PROVIDER_SITE_OTHER): Payer: PRIVATE HEALTH INSURANCE

## 2018-10-10 DIAGNOSIS — J309 Allergic rhinitis, unspecified: Secondary | ICD-10-CM | POA: Diagnosis not present

## 2018-10-17 ENCOUNTER — Ambulatory Visit (INDEPENDENT_AMBULATORY_CARE_PROVIDER_SITE_OTHER): Payer: PRIVATE HEALTH INSURANCE

## 2018-10-17 DIAGNOSIS — J309 Allergic rhinitis, unspecified: Secondary | ICD-10-CM | POA: Diagnosis not present

## 2018-10-31 ENCOUNTER — Ambulatory Visit (INDEPENDENT_AMBULATORY_CARE_PROVIDER_SITE_OTHER): Payer: PRIVATE HEALTH INSURANCE | Admitting: *Deleted

## 2018-10-31 DIAGNOSIS — J309 Allergic rhinitis, unspecified: Secondary | ICD-10-CM | POA: Diagnosis not present

## 2019-04-14 ENCOUNTER — Telehealth: Payer: Self-pay

## 2019-04-14 NOTE — Telephone Encounter (Signed)
Called patient to see if we need to renew his va auth and he stated at this time we do not need to.   Ref has been closed.

## 2020-09-07 ENCOUNTER — Emergency Department: Payer: No Typology Code available for payment source

## 2020-09-07 ENCOUNTER — Observation Stay
Admission: EM | Admit: 2020-09-07 | Discharge: 2020-09-08 | Disposition: A | Discharge status: Home / Self Care | Payer: No Typology Code available for payment source | Attending: Internal Medicine | Admitting: Internal Medicine

## 2020-09-07 ENCOUNTER — Encounter: Payer: Self-pay | Admitting: Emergency Medicine

## 2020-09-07 ENCOUNTER — Other Ambulatory Visit: Payer: Self-pay

## 2020-09-07 DIAGNOSIS — W294XXA Contact with nail gun, initial encounter: Secondary | ICD-10-CM | POA: Insufficient documentation

## 2020-09-07 DIAGNOSIS — Y9389 Activity, other specified: Secondary | ICD-10-CM | POA: Insufficient documentation

## 2020-09-07 DIAGNOSIS — J45909 Unspecified asthma, uncomplicated: Secondary | ICD-10-CM | POA: Insufficient documentation

## 2020-09-07 DIAGNOSIS — I10 Essential (primary) hypertension: Secondary | ICD-10-CM | POA: Diagnosis not present

## 2020-09-07 DIAGNOSIS — S60551A Superficial foreign body of right hand, initial encounter: Secondary | ICD-10-CM | POA: Diagnosis not present

## 2020-09-07 DIAGNOSIS — Z87891 Personal history of nicotine dependence: Secondary | ICD-10-CM | POA: Diagnosis not present

## 2020-09-07 DIAGNOSIS — Z79899 Other long term (current) drug therapy: Secondary | ICD-10-CM | POA: Diagnosis not present

## 2020-09-07 DIAGNOSIS — Y92009 Unspecified place in unspecified non-institutional (private) residence as the place of occurrence of the external cause: Secondary | ICD-10-CM | POA: Diagnosis not present

## 2020-09-07 DIAGNOSIS — S61239A Puncture wound without foreign body of unspecified finger without damage to nail, initial encounter: Secondary | ICD-10-CM | POA: Diagnosis not present

## 2020-09-07 LAB — CBC WITH DIFFERENTIAL/PLATELET
Abs Immature Granulocytes: 0.1 10*3/uL — ABNORMAL HIGH (ref 0.00–0.07)
Basophils Absolute: 0 10*3/uL (ref 0.0–0.1)
Basophils Relative: 0 %
Eosinophils Absolute: 0 10*3/uL (ref 0.0–0.5)
Eosinophils Relative: 0 %
HCT: 39.8 % (ref 39.0–52.0)
Hemoglobin: 12.9 g/dL — ABNORMAL LOW (ref 13.0–17.0)
Immature Granulocytes: 1 %
Lymphocytes Relative: 10 %
Lymphs Abs: 0.9 10*3/uL (ref 0.7–4.0)
MCH: 29.9 pg (ref 26.0–34.0)
MCHC: 32.4 g/dL (ref 30.0–36.0)
MCV: 92.3 fL (ref 80.0–100.0)
Monocytes Absolute: 0.5 10*3/uL (ref 0.1–1.0)
Monocytes Relative: 5 %
Neutro Abs: 7.6 10*3/uL (ref 1.7–7.7)
Neutrophils Relative %: 84 %
Platelets: 190 10*3/uL (ref 150–400)
RBC: 4.31 MIL/uL (ref 4.22–5.81)
RDW: 14.1 % (ref 11.5–15.5)
WBC: 9.1 10*3/uL (ref 4.0–10.5)
nRBC: 0 % (ref 0.0–0.2)

## 2020-09-07 LAB — BASIC METABOLIC PANEL
Anion gap: 8 (ref 5–15)
BUN: 19 mg/dL (ref 6–20)
CO2: 25 mmol/L (ref 22–32)
Calcium: 9.2 mg/dL (ref 8.9–10.3)
Chloride: 102 mmol/L (ref 98–111)
Creatinine, Ser: 1.31 mg/dL — ABNORMAL HIGH (ref 0.61–1.24)
GFR, Estimated: >60 mL/min (ref >60)
Glucose, Bld: 164 mg/dL — ABNORMAL HIGH (ref 70–99)
Potassium: 4.1 mmol/L (ref 3.5–5.1)
Sodium: 135 mmol/L (ref 135–145)

## 2020-09-07 MED ORDER — ATORVASTATIN CALCIUM 20 MG PO TABS
20.0000 mg | ORAL_TABLET | Freq: Every day | ORAL | Status: DC
  Administered 2020-09-07: 22:00:00 20 mg via ORAL
  Filled 2020-09-07: qty 1

## 2020-09-07 MED ORDER — ONDANSETRON HCL 4 MG PO TABS: 4.0000 mg | ORAL_TABLET | Freq: Four times a day (QID) | ORAL | Status: DC | PRN

## 2020-09-07 MED ORDER — HYDROCHLOROTHIAZIDE 25 MG PO TABS
12.5000 mg | ORAL_TABLET | Freq: Every morning | ORAL | Status: DC
  Administered 2020-09-08: 10:00:00 12.5 mg via ORAL
  Filled 2020-09-07: qty 1

## 2020-09-07 MED ORDER — OXYCODONE-ACETAMINOPHEN 5-325 MG PO TABS
2.0000 | ORAL_TABLET | Freq: Once | ORAL | Status: AC
  Administered 2020-09-07: 2 via ORAL
  Filled 2020-09-07: qty 2

## 2020-09-07 MED ORDER — DICLOFENAC SODIUM 1 % EX GEL
4.0000 g | Freq: Four times a day (QID) | CUTANEOUS | Status: DC
  Filled 2020-09-07: qty 100

## 2020-09-07 MED ORDER — DULOXETINE HCL 30 MG PO CPEP
30.0000 mg | ORAL_CAPSULE | Freq: Every day | ORAL | Status: DC
  Administered 2020-09-08: 10:00:00 30 mg via ORAL
  Filled 2020-09-07: qty 1

## 2020-09-07 MED ORDER — PREDNISONE 20 MG PO TABS
25.0000 mg | ORAL_TABLET | Freq: Every day | ORAL | Status: DC
  Administered 2020-09-08: 08:00:00 25 mg via ORAL
  Filled 2020-09-07: qty 1

## 2020-09-07 MED ORDER — PREGABALIN 25 MG PO CAPS
100.0000 mg | ORAL_CAPSULE | Freq: Two times a day (BID) | ORAL | Status: DC
  Administered 2020-09-07: 22:00:00 100 mg via ORAL
  Administered 2020-09-08: 10:00:00 200 mg via ORAL
  Filled 2020-09-07 (×3): qty 4

## 2020-09-07 MED ORDER — ACETAMINOPHEN 650 MG RE SUPP: 650.0000 mg | Freq: Four times a day (QID) | RECTAL | Status: DC | PRN

## 2020-09-07 MED ORDER — LISINOPRIL 10 MG PO TABS
10.0000 mg | ORAL_TABLET | Freq: Every day | ORAL | Status: DC
  Administered 2020-09-08: 10:00:00 10 mg via ORAL
  Filled 2020-09-07: qty 1

## 2020-09-07 MED ORDER — MORPHINE SULFATE (PF) 2 MG/ML IV SOLN
2.0000 mg | INTRAVENOUS | Status: DC | PRN
Start: 2020-09-07 — End: 2020-09-08
  Administered 2020-09-07 – 2020-09-08 (×3): 2 mg via INTRAVENOUS
  Filled 2020-09-07 (×3): qty 1

## 2020-09-07 MED ORDER — LOPERAMIDE HCL 2 MG PO CAPS: 2.0000 mg | ORAL_CAPSULE | ORAL | Status: DC | PRN

## 2020-09-07 MED ORDER — TRAZODONE HCL 50 MG PO TABS: 50.0000 mg | ORAL_TABLET | Freq: Every evening | ORAL | Status: DC | PRN

## 2020-09-07 MED ORDER — ONDANSETRON HCL 4 MG/2ML IJ SOLN: 4.0000 mg | Freq: Once | INTRAMUSCULAR | Status: DC

## 2020-09-07 MED ORDER — ONDANSETRON HCL 4 MG/2ML IJ SOLN
4.0000 mg | Freq: Four times a day (QID) | INTRAMUSCULAR | Status: DC | PRN
  Administered 2020-09-07: 18:00:00 4 mg via INTRAVENOUS
  Filled 2020-09-07: qty 2

## 2020-09-07 MED ORDER — ONDANSETRON 4 MG PO TBDP
4.0000 mg | ORAL_TABLET | Freq: Once | ORAL | Status: AC
  Administered 2020-09-07: 4 mg via ORAL
  Filled 2020-09-07: qty 1

## 2020-09-07 MED ORDER — CYCLOBENZAPRINE HCL 10 MG PO TABS
10.0000 mg | ORAL_TABLET | Freq: Every day | ORAL | Status: DC
  Administered 2020-09-07: 22:00:00 10 mg via ORAL
  Filled 2020-09-07: qty 1

## 2020-09-07 MED ORDER — IPRATROPIUM-ALBUTEROL 20-100 MCG/ACT IN AERS
1.0000 | INHALATION_SPRAY | Freq: Four times a day (QID) | RESPIRATORY_TRACT | Status: DC
  Filled 2020-09-07: qty 4

## 2020-09-07 MED ORDER — SODIUM CHLORIDE 0.9 % IV SOLN
3.0000 g | Freq: Four times a day (QID) | INTRAVENOUS | Status: DC
  Administered 2020-09-07 – 2020-09-08 (×3): 3 g via INTRAVENOUS
  Filled 2020-09-07 (×3): qty 8

## 2020-09-07 MED ORDER — TRAZODONE HCL 100 MG PO TABS
200.0000 mg | ORAL_TABLET | Freq: Every day | ORAL | Status: DC
  Administered 2020-09-07: 22:00:00 200 mg via ORAL
  Filled 2020-09-07: qty 2

## 2020-09-07 MED ORDER — FOLIC ACID 1 MG PO TABS
1.0000 mg | ORAL_TABLET | Freq: Every day | ORAL | Status: DC
  Administered 2020-09-08: 10:00:00 1 mg via ORAL
  Filled 2020-09-07: qty 1

## 2020-09-07 MED ORDER — TRAMADOL HCL 50 MG PO TABS
50.0000 mg | ORAL_TABLET | Freq: Two times a day (BID) | ORAL | Status: DC | PRN
Start: 2020-09-07 — End: 2020-09-08

## 2020-09-07 MED ORDER — ACETAMINOPHEN 325 MG PO TABS: 650.0000 mg | ORAL_TABLET | Freq: Four times a day (QID) | ORAL | Status: DC | PRN

## 2020-09-07 MED ORDER — PANTOPRAZOLE SODIUM 40 MG PO TBEC
40.0000 mg | DELAYED_RELEASE_TABLET | Freq: Every day | ORAL | Status: DC
  Administered 2020-09-08: 10:00:00 40 mg via ORAL
  Filled 2020-09-07: qty 1

## 2020-09-07 MED ORDER — LIDOCAINE HCL 1 % IJ SOLN
5.0000 mL | Freq: Once | INTRAMUSCULAR | Status: AC
  Administered 2020-09-07: 5 mL via INTRADERMAL
  Filled 2020-09-07: qty 10

## 2020-09-07 MED ORDER — BUPIVACAINE HCL 0.5 % IJ SOLN
50.0000 mL | Freq: Once | INTRAMUSCULAR | Status: AC
  Administered 2020-09-07: 50 mL
  Filled 2020-09-07: qty 50

## 2020-09-07 MED ORDER — CLONIDINE HCL 0.1 MG PO TABS
0.1000 mg | ORAL_TABLET | Freq: Two times a day (BID) | ORAL | Status: DC
  Administered 2020-09-07 – 2020-09-08 (×2): 0.1 mg via ORAL
  Filled 2020-09-07 (×2): qty 1

## 2020-09-07 MED ORDER — AMPICILLIN-SULBACTAM SODIUM 3 (2-1) G IJ SOLR
3.0000 g | Freq: Once | INTRAMUSCULAR | Status: AC
  Administered 2020-09-07: 3 g via INTRAVENOUS
  Filled 2020-09-07: qty 8

## 2020-09-07 MED ORDER — OXYCODONE HCL 5 MG PO TABS
5.0000 mg | ORAL_TABLET | ORAL | Status: DC | PRN
  Administered 2020-09-08 (×2): 5 mg via ORAL
  Filled 2020-09-07 (×2): qty 1

## 2020-09-07 MED ORDER — ACETAMINOPHEN 325 MG PO TABS
325.0000 mg | ORAL_TABLET | Freq: Every day | ORAL | Status: DC
  Administered 2020-09-07 – 2020-09-08 (×3): 325 mg via ORAL
  Filled 2020-09-07 (×3): qty 1

## 2020-09-07 NOTE — H&P (Signed)
History and Physical    Sean Cain [id number] DOB: Nov 14, 1965 DOA: 09/07/2020  PCP: [facility] [place]  Patient coming from: home  I have personally briefly reviewed patient's old medical records in [place]  Chief Complaint: puncture wound to finger  HPI: Sean Cain is a 54 y.o. male with medical history significant of Crohn's disease (on prednisone, methotrexate and Stellara), asthma, HTN, HLD, GERD presented to the ED today after an accident with use of a nail gun in which a long nail punctured the soft tissue of 2nd through 4th digits on the right hand.    When seen on admission, nail had been removed and gauze dressing in place.  Pt denies pain due to local anesthetic.  No recent acute illnesses other than mild Crohn's flare with nonbloody diarrhea and mild intermittent abdominal pain.  Last TDAp was less than 5 years ago.   Denies fever/chills, cough, sore throat, congestion, chest pain, SOB, nausea vomiting or other recent symptoms.    ED Course: Right hand xray showed 7 cm metallic nail projects within the volar soft tissues of the right second, third, and fourth fingers at the level of the proximal phalanx without evidence of bony involvement.  Nail was removed with use of lidocaine without epi for local anesthetic.  Case was discussed with orthopedist who recommended 24 hours prophylactic IV antibiotics given patient's immunosuppressing medications.    Review of Systems: As per HPI otherwise 10 point review of systems negative.    Past Medical History:  Diagnosis Date  . Asthma   . Depression   . Hyperlipidemia   . Hypertension   . Shoulder mass 2017    Past Surgical History:  Procedure Laterality Date  . KNEE CARTILAGE SURGERY  2002, 2005  . LAPAROSCOPIC SMALL BOWEL RESECTION  2011  . LIPOMA EXCISION Left 07/05/2016   Procedure: EXCISION LIPOMA;  Surgeon: Florene Glen, MD;  Location: [facility] ORS;  Service: General;  Laterality:  Left;  . NASAL SINUS SURGERY  2006  . nissenfundoplication  [id number]     reports that he quit smoking about 22 years ago. He has a 10.00 pack-year smoking history. He has quit using smokeless tobacco. He reports that he does not drink alcohol and does not use drugs.  Allergies  Allergen Reactions  . Remicade [Infliximab] Anaphylaxis  . Flonase [Fluticasone Propionate] Hives  . Singulair [Montelukast Sodium]   . Montelukast Other (See Comments)    Other Reaction: Other reaction    Family History  Problem Relation Age of Onset  . Alzheimer's disease Mother   . Heart disease Father   . Asthma Father   . Allergic rhinitis Neg Hx   . Angioedema Neg Hx   . Eczema Neg Hx   . Immunodeficiency Neg Hx   . Urticaria Neg Hx     Prior to Admission medications   Medication Sig Start Date End Date Taking? Authorizing Provider  Adalimumab (HUMIRA PEN-CROHNS STARTER) 40 MG/0.8ML PNKT Inject 40 mg into the skin once a week.    [provider]  atorvastatin (LIPITOR) 40 MG tablet Take 40 mg by mouth at bedtime.     [provider]  azelastine (ASTELIN) 0.1 % nasal spray Place 2 sprays into both nostrils 2 (two) times daily as needed for rhinitis. 05/07/17   Valentina Shaggy, MD  buPROPion (ZYBAN) 150 MG 12 hr tablet Take 150-300 mg by mouth 2 (two) times daily. 300 mg in every morning, 150 mg at  bedtime    [provider]  Calcium Carbonate-Vitamin D3 (CALCIUM 600-D) 600-400 MG-UNIT TABS Take 1 tablet by mouth daily.    [provider]  cetirizine (ZYRTEC) 10 MG tablet Take by mouth. 12/29/07   [provider]  cyclobenzaprine (FLEXERIL) 10 MG tablet Take 10 mg by mouth at bedtime.    [provider]  DULoxetine (CYMBALTA) 60 MG capsule Take 60 mg by mouth daily.    [provider]  etodolac (LODINE) 400 MG tablet Take 400 mg by mouth 2 (two) times daily.    [provider]  folic acid (FOLVITE) 1 MG tablet Take 1 mg by mouth  daily.    [provider]  hydrochlorothiazide (HYDRODIURIL) 12.5 MG tablet Take 12.5 mg by mouth daily.    [provider]  HYDROcodone-acetaminophen (NORCO/VICODIN) 5-325 MG tablet Take 1 tablet by mouth every 4 (four) hours as needed for moderate pain. 07/05/16   Florene Glen, MD  lisinopril (PRINIVIL,ZESTRIL) 20 MG tablet Take 20 mg by mouth daily.    [provider]  predniSONE (DELTASONE) 10 MG tablet Take 10 mg by mouth daily with breakfast.    [provider]  pregabalin (LYRICA) 100 MG capsule Take 100-200 mg by mouth 2 (two) times daily. 200 mg every morning, 100 mg at bedtime    [provider]  tamsulosin (FLOMAX) 0.4 MG CAPS capsule Take 0.4 mg by mouth.    [provider]    Physical Exam: Vitals:   09/07/20 1148 09/07/20 1155  BP:  (!) 146/83  Pulse:  82  Resp:  19  Temp:  99.1 F (37.3 C)  TempSrc:  Oral  SpO2:  100%  Weight: 108.9 kg   Height: 6\' 6"  (1.981 m)      Constitutional: NAD, calm, comfortable Eyes: EOMI, lids and conjunctivae normal ENMT: Mucous membranes are moist. Hearing grossly normal.  Respiratory: CTAB, no wheezing, no crackles. Normal respiratory effort. No accessory muscle use.  Cardiovascular: RRR, no murmurs / rubs / gallops. No extremity edema.   Abdomen: soft, NT, ND, +Bowel sounds.  Musculoskeletal: R hand with gauze dressing over fingers without signs of bleeding. Moves all extremities.  Normal muscle tone.  Skin: dry, intact, normal color, normal temperature Neurologic: CN 2-12 grossly intact. Normal speech.  Grossly non-focal exam. Psychiatric: Alert and oriented x 3. Normal mood. Congruent affect.  Normal judgement and insight.   Labs on Admission: I have personally reviewed following labs and imaging studies  CBC: Recent Labs  Lab 09/07/20 1255  WBC 9.1  NEUTROABS 7.6  HGB 12.9*  HCT 39.8  MCV 92.3  PLT 99991111   Basic Metabolic Panel: Recent Labs  Lab 09/07/20 1255   NA 135  K 4.1  CL 102  CO2 25  GLUCOSE 164*  BUN 19  CREATININE 1.31*  CALCIUM 9.2   GFR: Estimated Creatinine Clearance: 82.4 mL/min (A) (by C-G formula based on SCr of 1.31 mg/dL (H)). Liver Function Tests: No results for input(s): AST, ALT, ALKPHOS, BILITOT, PROT, ALBUMIN in the last 168 hours. No results for input(s): LIPASE, AMYLASE in the last 168 hours. No results for input(s): AMMONIA in the last 168 hours. Coagulation Profile: No results for input(s): INR, PROTIME in the last 168 hours. Cardiac Enzymes: No results for input(s): CKTOTAL, CKMB, CKMBINDEX, TROPONINI in the last 168 hours. BNP (last 3 results) No results for input(s): PROBNP in the last 8760 hours. HbA1C: No results for input(s): HGBA1C in the last 72 hours.  CBG: No results for input(s): GLUCAP in the last 168 hours. Lipid Profile: No results for input(s): CHOL, HDL, LDLCALC, TRIG, CHOLHDL, LDLDIRECT in the last 72 hours. Thyroid Function Tests: No results for input(s): TSH, T4TOTAL, FREET4, T3FREE, THYROIDAB in the last 72 hours. Anemia Panel: No results for input(s): VITAMINB12, FOLATE, FERRITIN, TIBC, IRON, RETICCTPCT in the last 72 hours. Urine analysis: No results found for: COLORURINE, APPEARANCEUR, [place], [place], GLUCOSEU, [place], BILIRUBINUR, KETONESUR, PROTEINUR, UROBILINOGEN, NITRITE, LEUKOCYTESUR  Radiological Exams on Admission: DG Hand Complete Right  Result Date: 09/07/2020 CLINICAL DATA:  Nail gun injury to right hand EXAM: RIGHT HAND - COMPLETE 3+ VIEW COMPARISON:  None. FINDINGS: 7 cm metallic nail projects within the volar soft tissues of the right second, third, and fourth fingers at the level of the proximal phalanx. No evidence of bony involvement. No fracture identified. No malalignment. IMPRESSION: 7 cm metallic nail projects within the volar soft tissues of the right second, third, and fourth fingers at the level of the proximal phalanx without evidence of bony involvement.  Electronically Signed   By: Davina Poke D.O.   On: 09/07/2020 12:35    EKG: none  Assessment/Plan Active Problems:   Puncture wound of finger     Right hand puncture wound of 2nd, 3rd, 4th digits - due to nail gun accident.  Ortho consulted.  Recommended 24 hours Unasyn.  No need for surgical intervention.  No bony involvement on imaging.  Pain control with Tylenol, oxycodone, morphine PRN.   --Unasyn x 24 hours, d/c on Augmentin for course duration per ortho  Crohn's disease - with current mild typical flare symptoms, no fevers.  Continue daily prednisone.  Won't be due to methotrexate or Stellara while here most likely.  Imodium PRN per home meds.   Asthma - not acutely exacerbated.  Continue home Combivent.  HTN - continue lisinopril, clonidine  HLD - continue Lipitor  GERD - continue PPI  Chronic pain - continue home tramadol, Lyrica, Cymbalta, Voltaren gel   DVT prophylaxis: SCDs Start: 09/07/20 1500   Code Status: Full Family Communication: wife at bedside during admission enocounter Disposition Plan: expect d/c home tomorrow after 24 hrs IV antibiotics Consults called: orthopedics (Dr. Rudene Christians)  Admission status:  Status is: Observation  Observation status is appropriate as patient is expected to d/c before two midnights.  Dispo: The patient is from: Home              Anticipated d/c is to: Home              Anticipated d/c date is: 1 day              Patient currently is not medically stable for d/c.    Ezekiel Slocumb, DO [facility]  09/07/2020, 3:03 PM    If 7PM-7AM, please contact night-coverage. How to contact the [facility] Attending or Consulting provider [place] or covering provider during after hours [place], for this patient?    1. Check the care team in [facility] and look for a) attending/consulting [facility] provider listed and b) the [facility] team listed 2. Log into [email or web address] and use [place]'s universal password to access. If you do not have the  password, please contact the hospital operator. 3. Locate the [facility] provider you are looking for under [facility] and page to a number that you can be directly reached. 4. If you still have difficulty reaching the provider, please page the [facility] (Director on Call) for the Hospitalists  listed on amion for assistance.

## 2020-09-07 NOTE — ED Provider Notes (Signed)
[facility] Emergency Department Provider Note  ____________________________________________   Event Date/Time   First MD Initiated Contact with Patient 09/07/20 1216     (approximate)  I have reviewed the triage vital signs and the nursing notes.   HISTORY  Chief Complaint Foreign Body in Skin    HPI Sean Cain is a 54 y.o. male  Right hand dominant male here with R hand pain, swelling.  Pt was working in his house today when he accidentally struck his right hand with a nail gun. A smooth nail went through the proximal palmar aspect of his right second, third, and fourth digits. Reports immediate onset of sharp, stabbing pain, worse w/ any movement. He does not believe he's had any distal numbness, weakness, or cyanosis. Pain is worse w/ any movement. Last TDAp was <5 years ago. Of note, he is on chronic high dose injectables and prednisone for his Crohn's disease.       Past Medical History:  Diagnosis Date  . Asthma   . Depression   . Hyperlipidemia   . Hypertension   . Shoulder mass 2017    Patient Active Problem List   Diagnosis Date Noted  . Puncture wound of finger 09/07/2020  . Allergic rhinitis 08/13/2017  . Adverse food reaction 08/13/2017  . Crohn's disease with complication ([place]) 44/31/5400  . Lipoma of left upper extremity   . 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 Shoulder mass 04/19/2016    Past Surgical History:  Procedure Laterality Date  . KNEE CARTILAGE SURGERY  2002, 2005  . LAPAROSCOPIC SMALL BOWEL RESECTION  2011  . LIPOMA EXCISION Left 07/05/2016   Procedure: EXCISION LIPOMA;  Surgeon: Florene Glen, MD;  Location: [facility] ORS;  Service: General;  Laterality: Left;  . NASAL SINUS SURGERY  2006  . nissenfundoplication  [id number]    Prior to Admission medications   Medication Sig Start Date End Date Taking? Authorizing Provider  atorvastatin (LIPITOR) 40 MG tablet Take 20 mg by mouth at bedtime.   Yes [provider]  Calcium Carb-Cholecalciferol (CALCIUM CARBONATE-VITAMIN D3) 600-400 MG-UNIT TABS Take 1 tablet by mouth daily.   Yes [provider]  cloNIDine (CATAPRES) 0.1 MG tablet Take 0.1 mg by mouth 2 (two) times daily. 04/22/20  Yes [provider]  cyanocobalamin (,VITAMIN B-12,) 1000 MCG/ML injection Inject 1,000 mcg into the muscle every 30 (thirty) days. 07/18/20  Yes [provider]  cyclobenzaprine (FLEXERIL) 10 MG tablet Take 10 mg by mouth at bedtime.   Yes [provider]  DULoxetine (CYMBALTA) 30 MG capsule Take 30 mg by mouth daily. 06/23/20  Yes [provider]  folic acid (FOLVITE) 1 MG tablet Take 1 mg by mouth daily.   Yes [provider]  hydrochlorothiazide (HYDRODIURIL) 25 MG tablet Take 12.5 mg by mouth every morning. 06/21/20  Yes [provider]  lisinopril (PRINIVIL,ZESTRIL) 20 MG tablet Take 10 mg by mouth daily.   Yes [provider]  methotrexate 50 MG/2ML injection Inject 25 mg into the skin once a week. 05/21/20  Yes [provider]  omeprazole (PRILOSEC) 40 MG capsule Take 40 mg by mouth daily. 07/18/20  Yes [provider]  predniSONE (DELTASONE) 10 MG tablet Take 25 mg by mouth daily with breakfast.   Yes [provider]  pregabalin (LYRICA) 100 MG capsule Take 100-200 mg by mouth 2 (two) times daily. 200 mg every morning, 100 mg at bedtime   Yes [provider]  STELARA 90 MG/ML SOSY injection Inject 1 mL into the skin every 28 (twenty-eight) days. 06/21/20  Yes [provider]  traMADol (ULTRAM) 50 MG tablet Take 50 mg by mouth 2 (two) times daily as needed. 08/23/20  Yes [provider]  traZODone (DESYREL) 100 MG tablet Take 200 mg by mouth at bedtime. 06/21/20  Yes [provider]  acetaminophen (TYLENOL) 325 MG tablet Take 325 mg by mouth 5 (five) times daily. 08/09/20   [provider]  ANTI-ITCH lotion Apply 1 application  topically 2 (two) times daily. 04/11/20   [provider]  calcipotriene (DOVONOX) 0.005 % cream Apply 1 application topically every morning. 08/29/20   [provider]  diclofenac Sodium (VOLTAREN) 1 % GEL Apply 4 g topically in the morning, at noon, in the evening, and at bedtime. 05/21/20   [provider]  EPINEPHrine 0.3 mg/0.3 mL IJ SOAJ injection Inject 0.3 mg into the muscle as needed for anaphylaxis.    [provider]  fluorouracil (EFUDEX) 5 % cream Apply 1 application topically 2 (two) times daily. 08/29/20   [provider]  Glucagon, rDNA, (GLUCAGON EMERGENCY) 1 MG KIT Inject 1 mg into the muscle once. 06/21/20   [provider]  Ipratropium-Albuterol (COMBIVENT RESPIMAT) 20-100 MCG/ACT AERS respimat Inhale 1 puff into the lungs every 6 (six) hours.    [provider]  loperamide (IMODIUM) 2 MG capsule Take 2 mg by mouth as needed for diarrhea or loose stools.    [provider]  Vitamin D, Ergocalciferol, (DRISDOL) 1.25 MG (50000 UNIT) CAPS capsule Take 50,000 Units by mouth every 7 (seven) days. Monday    [provider]    Allergies Fluticasone propionate, Other, Remicade [infliximab], Singulair [montelukast sodium], Flonase [fluticasone propionate], Humira [adalimumab], and Montelukast  Family History  Problem Relation Age of Onset  . Alzheimer's disease Mother   . Heart disease Father   . Asthma Father   . Allergic rhinitis Neg Hx   . Angioedema Neg Hx   . Eczema Neg Hx   . Immunodeficiency Neg Hx   . Urticaria Neg Hx     Social History Social History   Tobacco Use  . Smoking status: Former Smoker    Packs/day: 1.00    Years: 10.00    Pack years: 10.00    Quit date: 04/19/1998    Years since quitting: 22.4  . Smokeless tobacco: Former Network engineer Use Topics  . Alcohol use: No  . Drug use: No    Review of Systems  Review of Systems  Constitutional: Negative for chills and  fever.  HENT: Negative for sore throat.   Respiratory: Negative for shortness of breath.   Cardiovascular: Negative for chest pain.  Gastrointestinal: Negative for abdominal pain.  Genitourinary: Negative for flank pain.  Musculoskeletal: Negative for neck pain.  Skin: Positive for wound. Negative for rash.  Allergic/Immunologic: Negative for immunocompromised state.  Neurological: Negative for weakness and numbness.  Hematological: Does not bruise/bleed easily.  All other systems reviewed and are negative.    ____________________________________________  PHYSICAL EXAM:      VITAL SIGNS: ED Triage Vitals  Enc Vitals Group     BP 09/07/20 1155 (!) 146/83     Pulse Rate 09/07/20 1155 82     Resp 09/07/20 1155 19     Temp 09/07/20 1155 99.1 F (37.3 C)     Temp Source 09/07/20 1155 Oral     SpO2 09/07/20 1155 100 %  Weight 09/07/20 1148 240 lb (108.9 kg)     Height 09/07/20 1148 _0  (1.981 m)     Head Circumference --      Peak Flow --      Pain Score 09/07/20 1148 8     Pain Loc --      Pain Edu? --      Excl. in [place]? --      Physical Exam Vitals and nursing note reviewed.  Constitutional:      General: He is not in acute distress.    Appearance: He is well-developed and well-nourished.  HENT:     Head: Normocephalic and atraumatic.  Eyes:     Conjunctiva/sclera: Conjunctivae normal.  Cardiovascular:     Rate and Rhythm: Normal rate and regular rhythm.     Heart sounds: Normal heart sounds.  Pulmonary:     Effort: Pulmonary effort is normal. No respiratory distress.     Breath sounds: No wheezing.  Abdominal:     General: There is no distension.  Musculoskeletal:        General: No edema.     Cervical back: Neck supple.  Skin:    General: Skin is warm.     Capillary Refill: Capillary refill takes less than 2 seconds.     Findings: No rash.  Neurological:     Mental Status: He is alert and oriented to person, place, and time.     Motor: No abnormal  muscle tone.       ____________________________________________   LABS (all labs ordered are listed, but only abnormal results are displayed)  Labs Reviewed  CBC WITH DIFFERENTIAL/PLATELET - Abnormal; Notable for the following components:      Result Value   Hemoglobin 12.9 (*)    Abs Immature Granulocytes 0.10 (*)    All other components within normal limits  BASIC METABOLIC PANEL - Abnormal; Notable for the following components:   Glucose, Bld 164 (*)    Creatinine, Ser 1.31 (*)    All other components within normal limits  CULTURE, BLOOD (ROUTINE X 2)  CULTURE, BLOOD (ROUTINE X 2)  HIV ANTIBODY (ROUTINE TESTING W REFLEX)  CBC  BASIC METABOLIC PANEL    ____________________________________________  EKG:  ________________________________________  RADIOLOGY All imaging, including plain films, CT scans, and ultrasounds, independently reviewed by me, and interpretations confirmed via formal radiology reads.  ED MD interpretation:   DG Hand: 7 cm metallic nail in volar soft tissues of digits, no apparent bony involvement  Official radiology report(s): DG Hand Complete Right  Result Date: 09/07/2020 CLINICAL DATA:  Nail gun injury to right hand EXAM: RIGHT HAND - COMPLETE 3+ VIEW COMPARISON:  None. FINDINGS: 7 cm metallic nail projects within the volar soft tissues of the right second, third, and fourth fingers at the level of the proximal phalanx. No evidence of bony involvement. No fracture identified. No malalignment. IMPRESSION: 7 cm metallic nail projects within the volar soft tissues of the right second, third, and fourth fingers at the level of the proximal phalanx without evidence of bony involvement. Electronically Signed   By: Davina Poke D.O.   On: 09/07/2020 12:35    ____________________________________________  PROCEDURES   Procedure(s) performed (including Critical Care):  .Foreign Body Removal  Date/Time: 09/07/2020 2:15 PM Performed by: Duffy Bruce, MD Authorized by: Duffy Bruce, MD  Consent: Verbal consent obtained. Risks and benefits: risks, benefits and alternatives were discussed Consent given by: patient Patient understanding: patient does not state understanding of the  procedure being performed Patient consent: the patient's understanding of the procedure does not match consent given Procedure consent: procedure consent does not match procedure scheduled Relevant documents: relevant documents not present or verified Test results: test results not available Site marked: the operative site was not marked Imaging studies: imaging studies not available Required items: required blood products, implants, devices, and special equipment available Patient identity confirmed: arm band Time out: Immediately prior to procedure a "time out" was called to verify the correct patient, procedure, equipment, support staff and site/side marked as required. Intake: Right hand injury. Anesthesia: digital block  Anesthesia: Local Anesthetic: lidocaine 2% without epinephrine Anesthetic total: 9 mL Patient restrained: no Complexity: simple 1 objects recovered. Objects recovered: Single nail Post-procedure assessment: foreign body removed Patient tolerance: patient tolerated the procedure well with no immediate complications    ____________________________________________  INITIAL IMPRESSION / MDM / ASSESSMENT AND PLAN / ED COURSE  As part of my medical decision making, I reviewed the following data within the [place] notes reviewed and incorporated, Old chart reviewed, Notes from prior ED visits, and [place] [employer] Controlled Substance Database       *PHILLIPE CLEMON was evaluated in Emergency Department on 09/07/2020 for the symptoms described in the history of present illness. He was evaluated in the context of the global COVID-19 pandemic, which necessitated consideration that the patient might be at risk  for infection with the SARS-CoV-2 virus that causes COVID-19. Institutional protocols and algorithms that pertain to the evaluation of patients at risk for COVID-19 are in a state of rapid change based on information released by regulatory bodies including the CDC and federal and state organizations. These policies and algorithms were followed during the patient's care in the ED.  Some ED evaluations and interventions may be delayed as a result of limited staffing during the pandemic.*     Medical Decision Making:  53 yo right-hand dominant male here with foreign body to R hand. Imaged reviewed, no signs of bony injury. Pt has no apparent flexor tendon injuries on exam though somewhat limited 2/2 swelling. Discussed case with Orthopedics Dr. Rudene Christians, who will see pt in ED. Recommends removal and admission to inpt for IV ABX. Pt updated. Tetanus is UTD. Labs overall reassuring/unremarkable. No leukocytosis. BMP with Cr 1.3.    ____________________________________________  FINAL CLINICAL IMPRESSION(S) / ED DIAGNOSES  Final diagnoses:  Superficial foreign body of right hand, initial encounter     MEDICATIONS GIVEN DURING THIS VISIT:  Medications  acetaminophen (TYLENOL) tablet 650 mg (has no administration in time range)    Or  acetaminophen (TYLENOL) suppository 650 mg (has no administration in time range)  oxyCODONE (Oxy IR/ROXICODONE) immediate release tablet 5 mg (has no administration in time range)  morphine 2 MG/ML injection 2 mg (2 mg Intravenous Given 09/07/20 1758)  ondansetron (ZOFRAN) tablet 4 mg ( Oral See Alternative 09/07/20 1758)    Or  ondansetron (ZOFRAN) injection 4 mg (4 mg Intravenous Given 09/07/20 1758)  traZODone (DESYREL) tablet 50 mg (has no administration in time range)  Ampicillin-Sulbactam (UNASYN) 3 g in sodium chloride 0.9 % 100 mL IVPB (has no administration in time range)  oxyCODONE-acetaminophen (PERCOCET/ROXICET) 5-325 MG per tablet 2 tablet (2 tablets Oral  Given 09/07/20 1220)  ondansetron (ZOFRAN-ODT) disintegrating tablet 4 mg (4 mg Oral Given 09/07/20 1220)  Ampicillin-Sulbactam (UNASYN) 3 g in sodium chloride 0.9 % 100 mL IVPB (0 g Intravenous Stopped 09/07/20 1400)  lidocaine (XYLOCAINE) 1 % (with pres) injection  5 mL (5 mLs Intradermal Given by Other 09/07/20 1315)  bupivacaine (MARCAINE) 0.5 % (with pres) injection 50 mL (50 mLs Infiltration Given by Other 09/07/20 1315)     ED Discharge Orders    None       Note:  This document was prepared using Dragon voice recognition software and may include unintentional dictation errors.   Duffy Bruce, MD 09/07/20 [place]

## 2020-09-07 NOTE — Consult Note (Signed)
Patient with multiple puncture wounds and immunosuppressed.  Flexor and extensor intact. Will need IV antibiotics overnight, oral on discharge and follow up with me next Wednesday

## 2020-09-07 NOTE — Consult Note (Signed)
Pharmacy Antibiotic Note  Sean Cain is a 54 y.o. male with medical history including Crohn's disease on immunomodulators admitted on 09/07/2020 with penetrating trauma to hand with nail. Pharmacy has been consulted for Unasyn dosing for prophylaxis against infection.   Plan: Unasyn 3 g IV q6h  Height: 6\' 6"  (198.1 cm) Weight: 108.9 kg (240 lb) IBW/kg (Calculated) : 91.4  Temp (24hrs), Avg:99.1 F (37.3 C), Min:99.1 F (37.3 C), Max:99.1 F (37.3 C)  Recent Labs  Lab 09/07/20 1255  WBC 9.1  CREATININE 1.31*    Estimated Creatinine Clearance: 82.4 mL/min (A) (by C-G formula based on SCr of 1.31 mg/dL (H)).    Allergies  Allergen Reactions  . Remicade [Infliximab] Anaphylaxis  . Flonase [Fluticasone Propionate] Hives  . Singulair [Montelukast Sodium]   . Montelukast Other (See Comments)    Other Reaction: Other reaction    Antimicrobials this admission: Unasyn 12/22 >>   Dose adjustments this admission: N/A  Microbiology results: 12/22 BCx: pending  Thank you for allowing pharmacy to be a part of this patient's care.  Benita Gutter 09/07/2020 3:17 PM

## 2020-09-07 NOTE — ED Notes (Signed)
See triage note, pt using nail gun, has nail from first finger through ring finger. NAD noted

## 2020-09-07 NOTE — ED Notes (Signed)
Pain meds given.  Pt has pain in right fingers from the injury.

## 2020-09-07 NOTE — ED Notes (Signed)
Radiology at bedside

## 2020-09-07 NOTE — ED Triage Notes (Signed)
Pt reports was framing a wall and the nail gun shot through the fingers on his right hand. Pt with nail connecting 1st-4th digit on right hand.

## 2020-09-08 DIAGNOSIS — S61239D Puncture wound without foreign body of unspecified finger without damage to nail, subsequent encounter: Secondary | ICD-10-CM | POA: Diagnosis not present

## 2020-09-08 LAB — HIV ANTIBODY (ROUTINE TESTING W REFLEX): HIV Screen 4th Generation wRfx: NONREACTIVE

## 2020-09-08 LAB — CBC
HCT: 39.1 % (ref 39.0–52.0)
Hemoglobin: 13 g/dL (ref 13.0–17.0)
MCH: 31 pg (ref 26.0–34.0)
MCHC: 33.2 g/dL (ref 30.0–36.0)
MCV: 93.1 fL (ref 80.0–100.0)
Platelets: 183 10*3/uL (ref 150–400)
RBC: 4.2 MIL/uL — ABNORMAL LOW (ref 4.22–5.81)
RDW: 14.3 % (ref 11.5–15.5)
WBC: 10.7 10*3/uL — ABNORMAL HIGH (ref 4.0–10.5)
nRBC: 0 % (ref 0.0–0.2)

## 2020-09-08 LAB — BASIC METABOLIC PANEL
Anion gap: 8 (ref 5–15)
BUN: 14 mg/dL (ref 6–20)
CO2: 27 mmol/L (ref 22–32)
Calcium: 8.7 mg/dL — ABNORMAL LOW (ref 8.9–10.3)
Chloride: 103 mmol/L (ref 98–111)
Creatinine, Ser: 1.21 mg/dL (ref 0.61–1.24)
GFR, Estimated: >60 mL/min (ref >60)
Glucose, Bld: 184 mg/dL — ABNORMAL HIGH (ref 70–99)
Potassium: 3.3 mmol/L — ABNORMAL LOW (ref 3.5–5.1)
Sodium: 138 mmol/L (ref 135–145)

## 2020-09-08 MED ORDER — AMOXICILLIN-POT CLAVULANATE 875-125 MG PO TABS: 1.0000 | ORAL_TABLET | Freq: Two times a day (BID) | ORAL | 0 refills | Status: DC

## 2020-09-08 MED ORDER — AMOXICILLIN-POT CLAVULANATE 875-125 MG PO TABS: 1.0000 | ORAL_TABLET | Freq: Two times a day (BID) | ORAL | 0 refills | Status: AC

## 2020-09-08 MED ORDER — POTASSIUM CHLORIDE CRYS ER 20 MEQ PO TBCR
40.0000 meq | EXTENDED_RELEASE_TABLET | Freq: Once | ORAL | Status: AC
  Administered 2020-09-08: 10:00:00 40 meq via ORAL
  Filled 2020-09-08: qty 2

## 2020-09-08 MED ORDER — OXYCODONE HCL 5 MG PO TABS: 5.0000 mg | ORAL_TABLET | Freq: Four times a day (QID) | ORAL | 0 refills | Status: AC | PRN

## 2020-09-08 MED ORDER — OXYCODONE HCL 5 MG PO TABS: 5.0000 mg | ORAL_TABLET | Freq: Four times a day (QID) | ORAL | 0 refills | Status: DC | PRN

## 2020-09-08 NOTE — ED Notes (Signed)
No acute changes-- pt remains awake and alert-- express meal sandwich provided at this time per pt request- no add'l needs questions concerns verbalized at this time.

## 2020-09-08 NOTE — ED Notes (Signed)
Pt awake, alert and oriented x4.  RR even and unlabored on RA.  Pt reporting stinging, burning aching pain to RUE digits 2-4 with numbness to RUE 2nd digit; cap refill <2 seconds; +R Radial pulse upon palpation and pt maintains ability to move all fingers well.  See MAR for pain medication administration.  Pt denies any other needs, questions, concerns at this time.  Will monitor for acute changes and maintain plan of care.

## 2020-09-08 NOTE — ED Notes (Signed)
Per Ms Stark Klein, NP-C pt to d/c home today after ABX therapy when asked if COVID swab could be ordered -- per bed placement if pt to d/c home will need admit orders/bed request d/c'd -- provider advises dayshift rounder(s) can d/c if "deemed appropriate"

## 2020-09-08 NOTE — Discharge Summary (Signed)
Physician Discharge Summary  Sean Cain UYQ:[id number] DOB: 1966-02-01 DOA: 09/07/2020  PCP: [facility]  Admit date: 09/07/2020 Discharge date: 09/08/2020  Admitted From: home Disposition:  home  Recommendations for Outpatient Follow-up:  1. Follow up with PCP in 1-2 weeks 2. Please obtain BMP/CBC in one week 3. Please follow up with Dr. Rudene Christians, orthopedic surgeon, on Wednesday next week.  Home Health: No  Equipment/Devices: None   Discharge Condition: Stable  CODE STATUS: Full  Diet recommendation: Heart Healthy    Discharge Diagnoses: Active Problems:   Puncture wound of finger    Summary of HPI and Hospital Course:   Sean Cain is a 54 y.o. male with medical history significant of Crohn's disease (on prednisone, methotrexate and Stellara), asthma, HTN, HLD, GERD presented to the ED today after an accident with use of a nail gun in which a long nail punctured the soft tissue of 2nd through 4th digits on the right hand.    When seen on admission, nail had been removed and gauze dressing in place.  Pt denies pain due to local anesthetic.  No recent acute illnesses other than mild Crohn's flare with nonbloody diarrhea and mild intermittent abdominal pain.  Last TDAp was less than 5 years ago.   Denies fever/chills, cough, sore throat, congestion, chest pain, SOB, nausea vomiting or other recent symptoms.    ED Course: Right hand xray showed 7 cm metallic nail projects within the volar soft tissues of the right second, third, and fourth fingers at the level of the proximal phalanx without evidence of bony involvement.  Nail was removed with use of lidocaine without epi for local anesthetic.  Case was discussed with orthopedist who recommended 24 hours prophylactic IV antibiotics given patient's immunosuppressing medications.    Patient was admitted for observation and started on IV Unasyn.   He clinically improved and had no fevers or local  signs of infection of the hand.  Discharged with 5 additional days of Augmentin and as needed pain medication.    Patient has follow up with Dr. Rudene Christians next Wednesday    Discharge Instructions    Discharge Instructions    Call MD for:  redness, tenderness, or signs of infection (pain, swelling, redness, odor or green/yellow discharge around incision site)   Complete by: As directed    Call MD for:  severe uncontrolled pain   Complete by: As directed    Call MD for:  temperature >100.4   Complete by: As directed    Diet - low sodium heart healthy   Complete by: As directed    Discharge instructions   Complete by: As directed    Please take antibiotic as prescribed for 4 more days. Per Dr. Rudene Christians, 5 total days of antibiotics. He wants to follow up with you in his clinic next Wednesday.   Increase activity slowly   Complete by: As directed      Allergies as of 09/08/2020      Reactions   Fluticasone Propionate Hives   Other Anaphylaxis, Shortness Of Breath   Dust mite, mold, horses, mice, cats, roaches, grass   Remicade [infliximab] Anaphylaxis   Singulair [montelukast Sodium] Other (See Comments)   periphial  swelling   Flonase [fluticasone Propionate] Hives   Humira [adalimumab] Hives   Montelukast Other (See Comments)   Other Reaction: Other reaction      Medication List    TAKE these medications   acetaminophen 325 MG tablet Commonly known as: TYLENOL  Take 325 mg by mouth 5 (five) times daily.   amoxicillin-clavulanate 875-125 MG tablet Commonly known as: Augmentin Take 1 tablet by mouth every 12 (twelve) hours for 4 days.   Anti-Itch lotion Generic drug: camphor-menthol Apply 1 application topically 2 (two) times daily.   atorvastatin 40 MG tablet Commonly known as: LIPITOR Take 20 mg by mouth at bedtime.   calcipotriene 0.005 % cream Commonly known as: DOVONOX Apply 1 application topically every morning.   Calcium Carbonate-Vitamin D3 600-400 MG-UNIT  Tabs Take 1 tablet by mouth daily.   cloNIDine 0.1 MG tablet Commonly known as: CATAPRES Take 0.1 mg by mouth 2 (two) times daily.   Combivent Respimat 20-100 MCG/ACT Aers respimat Generic drug: Ipratropium-Albuterol Inhale 1 puff into the lungs every 6 (six) hours.   cyanocobalamin 1000 MCG/ML injection Commonly known as: (VITAMIN B-12) Inject 1,000 mcg into the muscle every 30 (thirty) days.   cyclobenzaprine 10 MG tablet Commonly known as: FLEXERIL Take 10 mg by mouth at bedtime.   diclofenac Sodium 1 % Gel Commonly known as: VOLTAREN Apply 4 g topically in the morning, at noon, in the evening, and at bedtime.   DULoxetine 30 MG capsule Commonly known as: CYMBALTA Take 30 mg by mouth daily.   EPINEPHrine 0.3 mg/0.3 mL Soaj injection Commonly known as: EPI-PEN Inject 0.3 mg into the muscle as needed for anaphylaxis.   fluorouracil 5 % cream Commonly known as: EFUDEX Apply 1 application topically 2 (two) times daily.   folic acid 1 MG tablet Commonly known as: FOLVITE Take 1 mg by mouth daily.   Glucagon Emergency 1 MG Kit Inject 1 mg into the muscle once.   hydrochlorothiazide 25 MG tablet Commonly known as: HYDRODIURIL Take 12.5 mg by mouth every morning.   lisinopril 20 MG tablet Commonly known as: ZESTRIL Take 10 mg by mouth daily.   loperamide 2 MG capsule Commonly known as: IMODIUM Take 2 mg by mouth as needed for diarrhea or loose stools.   methotrexate 50 MG/2ML injection Inject 25 mg into the skin once a week.   omeprazole 40 MG capsule Commonly known as: PRILOSEC Take 40 mg by mouth daily.   oxyCODONE 5 MG immediate release tablet Commonly known as: Oxy IR/ROXICODONE Take 1 tablet (5 mg total) by mouth every 6 (six) hours as needed for up to 5 days for moderate pain or severe pain.   predniSONE 10 MG tablet Commonly known as: DELTASONE Take 25 mg by mouth daily with breakfast.   pregabalin 100 MG capsule Commonly known as: LYRICA Take  100-200 mg by mouth 2 (two) times daily. 200 mg every morning, 100 mg at bedtime   Stelara 90 MG/ML Sosy injection Generic drug: ustekinumab Inject 1 mL into the skin every 28 (twenty-eight) days.   traMADol 50 MG tablet Commonly known as: ULTRAM Take 50 mg by mouth 2 (two) times daily as needed.   traZODone 100 MG tablet Commonly known as: DESYREL Take 200 mg by mouth at bedtime.   Vitamin D (Ergocalciferol) 1.25 MG (50000 UNIT) Caps capsule Commonly known as: DRISDOL Take 50,000 Units by mouth every 7 (seven) days. Monday       Follow-up Information    Hessie Knows, MD. Schedule an appointment as soon as possible for a visit in 6 day(s).   Specialty: Orthopedic Surgery Why: Per Dr. Rudene Christians, should be seen next Wednesday Contact information: [address] [place] [address] [phone]  Allergies  Allergen Reactions  . Fluticasone Propionate Hives  . Other Anaphylaxis and Shortness Of Breath    Dust mite, mold, horses, mice, cats, roaches, grass  . Remicade [Infliximab] Anaphylaxis  . Singulair [Montelukast Sodium] Other (See Comments)    periphial  swelling  . Flonase [Fluticasone Propionate] Hives  . Humira [Adalimumab] Hives  . Montelukast Other (See Comments)    Other Reaction: Other reaction    Consultations:  Orthopedics    Procedures/Studies: DG Hand Complete Right  Result Date: 09/07/2020 CLINICAL DATA:  Nail gun injury to right hand EXAM: RIGHT HAND - COMPLETE 3+ VIEW COMPARISON:  None. FINDINGS: 7 cm metallic nail projects within the volar soft tissues of the right second, third, and fourth fingers at the level of the proximal phalanx. No evidence of bony involvement. No fracture identified. No malalignment. IMPRESSION: 7 cm metallic nail projects within the volar soft tissues of the right second, third, and fourth fingers at the level of the proximal phalanx without evidence of bony involvement.  Electronically Signed   By: Davina Poke D.O.   On: 09/07/2020 12:35       Subjective: Pt seen this Am and states he's feeling well.  Some pain at times in the hand but tolerable with emdication.  No fever/chills.     Discharge Exam: Vitals:   09/08/20 0231 09/08/20 0542  BP: 118/78 124/81  Pulse: 64 65  Resp: 20 20  Temp: 97.8 F (36.6 C) 97.7 F (36.5 C)  SpO2: 94% 95%   Vitals:   09/07/20 1804 09/07/20 2228 09/08/20 0231 09/08/20 0542  BP: 126/74 119/71 118/78 124/81  Pulse: 68 63 64 65  Resp: 18 17 20 20   Temp:   97.8 F (36.6 C) 97.7 F (36.5 C)  TempSrc:   Oral Oral  SpO2: 97% 94% 94% 95%  Weight:      Height:        General: Pt is alert, awake, not in acute distress Cardiovascular: RRR, S1/S2 +, no rubs, no gallops Respiratory: CTA bilaterally, no wheezing, no rhonchi Abdominal: Soft, NT, ND, bowel sounds + Extremities: no edema, no cyanosis, right hand dressing in place with intact distal sensation    The results of significant diagnostics from this hospitalization (including imaging, microbiology, ancillary and laboratory) are listed below for reference.     Microbiology: Recent Results (from the past 240 hour(s))  Blood culture (routine x 2)     Status: None (Preliminary result)   Collection Time: 09/07/20 12:55 PM   Specimen: BLOOD  Result Value Ref Range Status   Specimen Description BLOOD LEFT ANTECUBITAL  Final   Special Requests   Final    BOTTLES DRAWN AEROBIC AND ANAEROBIC Blood Culture adequate volume   Culture   Final    NO GROWTH < 24 HOURS Performed at [facility], [address] [place], [place] [address]    Report Status PENDING  Incomplete  Blood culture (routine x 2)     Status: None (Preliminary result)   Collection Time: 09/07/20 12:55 PM   Specimen: BLOOD  Result Value Ref Range Status   Specimen Description BLOOD BLOOD LEFT FOREARM  Final   Special Requests   Final    BOTTLES DRAWN AEROBIC AND ANAEROBIC  Blood Culture adequate volume   Culture   Final    NO GROWTH < 24 HOURS Performed at [facility], [address] [place], [place] [address]    Report Status PENDING  Incomplete  Labs: BNP (last 3 results) No results for input(s): BNP in the last 8760 hours. Basic Metabolic Panel: Recent Labs  Lab 09/07/20 1255 09/08/20 0533  NA 135 138  K 4.1 3.3*  CL 102 103  CO2 25 27  GLUCOSE 164* 184*  BUN 19 14  CREATININE 1.31* 1.21  CALCIUM 9.2 8.7*   Liver Function Tests: No results for input(s): AST, ALT, ALKPHOS, BILITOT, PROT, ALBUMIN in the last 168 hours. No results for input(s): LIPASE, AMYLASE in the last 168 hours. No results for input(s): AMMONIA in the last 168 hours. CBC: Recent Labs  Lab 09/07/20 1255 09/08/20 0533  WBC 9.1 10.7*  NEUTROABS 7.6  --   HGB 12.9* 13.0  HCT 39.8 39.1  MCV 92.3 93.1  PLT 190 183   Cardiac Enzymes: No results for input(s): CKTOTAL, CKMB, CKMBINDEX, TROPONINI in the last 168 hours. BNP: Invalid input(s): POCBNP CBG: No results for input(s): GLUCAP in the last 168 hours. D-Dimer No results for input(s): DDIMER in the last 72 hours. Hgb A1c No results for input(s): HGBA1C in the last 72 hours. Lipid Profile No results for input(s): CHOL, HDL, LDLCALC, TRIG, CHOLHDL, LDLDIRECT in the last 72 hours. Thyroid function studies No results for input(s): TSH, T4TOTAL, T3FREE, THYROIDAB in the last 72 hours.  Invalid input(s): FREET3 Anemia work up No results for input(s): VITAMINB12, FOLATE, FERRITIN, TIBC, IRON, RETICCTPCT in the last 72 hours. Urinalysis No results found for: COLORURINE, APPEARANCEUR, [place], [place], [place], [place], [place], [place], [place], UROBILINOGEN, NITRITE, LEUKOCYTESUR Sepsis Labs Invalid input(s): PROCALCITONIN,  WBC,  LACTICIDVEN Microbiology Recent Results (from the past 240 hour(s))  Blood culture (routine x 2)     Status: None (Preliminary result)   Collection Time:  09/07/20 12:55 PM   Specimen: BLOOD  Result Value Ref Range Status   Specimen Description BLOOD LEFT ANTECUBITAL  Final   Special Requests   Final    BOTTLES DRAWN AEROBIC AND ANAEROBIC Blood Culture adequate volume   Culture   Final    NO GROWTH < 24 HOURS Performed at [facility], [address] [place], [place] [address]    Report Status PENDING  Incomplete  Blood culture (routine x 2)     Status: None (Preliminary result)   Collection Time: 09/07/20 12:55 PM   Specimen: BLOOD  Result Value Ref Range Status   Specimen Description BLOOD BLOOD LEFT FOREARM  Final   Special Requests   Final    BOTTLES DRAWN AEROBIC AND ANAEROBIC Blood Culture adequate volume   Culture   Final    NO GROWTH < 24 HOURS Performed at [facility], [address], [place], [place] [address]    Report Status PENDING  Incomplete     Time coordinating discharge: Over 30 minutes  SIGNED:   Ezekiel Slocumb, DO [facility] 09/08/2020, 9:45 AM   If 7PM-7AM, please contact night-coverage [email or web address]

## 2020-09-08 NOTE — ED Notes (Signed)
Pt reports ongoing pain to RUE digits 2-4 after pt has received PO Oxicodone; pain now rated 8/10  -- 2mg  IVP Morphine now administered -- pt denies any additional needs or concerns at this time.

## 2020-09-12 LAB — CULTURE, BLOOD (ROUTINE X 2)
Culture: NO GROWTH
Culture: NO GROWTH
Special Requests: ADEQUATE
Special Requests: ADEQUATE

## 2020-11-29 ENCOUNTER — Other Ambulatory Visit: Payer: Self-pay

## 2020-11-29 ENCOUNTER — Ambulatory Visit (INDEPENDENT_AMBULATORY_CARE_PROVIDER_SITE_OTHER): Payer: No Typology Code available for payment source | Admitting: Podiatry

## 2020-11-29 DIAGNOSIS — E119 Type 2 diabetes mellitus without complications: Secondary | ICD-10-CM

## 2020-11-29 DIAGNOSIS — E0843 Diabetes mellitus due to underlying condition with diabetic autonomic (poly)neuropathy: Secondary | ICD-10-CM | POA: Diagnosis not present

## 2020-11-29 NOTE — Progress Notes (Signed)
   HPI: 55 y.o. male presenting today as a new patient recently diagnosed with DM type II as a referral from his PCP for evaluation and diabetic foot exam.  Patient states that recently he was diagnosed with diabetes.  He has a longstanding history of Lyrica for the past 8 years for chronic idiopathic pain.  He was referred by his PCP to have his feet evaluated and assessed.  Past Medical History:  Diagnosis Date  . Asthma   . Depression   . Hyperlipidemia   . Hypertension   . Shoulder mass 2017     Physical Exam: General: The patient is alert and oriented x3 in no acute distress.  Dermatology: Skin is warm, dry and supple bilateral lower extremities. Negative for open lesions or macerations.  There is some diffuse hyperkeratosis with xerosis of the skin.  No preulcerative calluses that are concerning or noticeable  Vascular: Palpable pedal pulses bilaterally. No edema or erythema noted. Capillary refill within normal limits.  Neurological: Epicritic and protective threshold diminished bilaterally.   Musculoskeletal Exam: Range of motion within normal limits to all pedal and ankle joints bilateral. Muscle strength 5/5 in all groups bilateral.      Assessment: 1.  DM type II; controlled   Plan of Care:  1. Patient evaluated. 2.  Comprehensive diabetic foot exam performed today 3.  Continue management of blood glucose levels as per PCP 4.  Recommend good supportive shoes.  The patient has custom molded orthotics from the [place].  Continue. 5.  Continue Lyrica as per PCP 6.  Return to clinic annually      Edrick Kins, DPM [facility]  Dr. Edrick Kins, DPM    [address], [place] [address]                Office [phone]  Fax [phone]

## 2021-02-19 ENCOUNTER — Encounter: Payer: Self-pay | Admitting: Emergency Medicine

## 2021-02-19 ENCOUNTER — Emergency Department
Admission: EM | Admit: 2021-02-19 | Discharge: 2021-02-19 | Disposition: A | Discharge status: Home / Self Care | Payer: No Typology Code available for payment source | Attending: Emergency Medicine | Admitting: Emergency Medicine

## 2021-02-19 ENCOUNTER — Other Ambulatory Visit: Payer: Self-pay

## 2021-02-19 DIAGNOSIS — T7840XA Allergy, unspecified, initial encounter: Secondary | ICD-10-CM | POA: Insufficient documentation

## 2021-02-19 DIAGNOSIS — L509 Urticaria, unspecified: Secondary | ICD-10-CM | POA: Insufficient documentation

## 2021-02-19 DIAGNOSIS — Z5321 Procedure and treatment not carried out due to patient leaving prior to being seen by health care provider: Secondary | ICD-10-CM | POA: Diagnosis not present

## 2021-02-19 NOTE — ED Triage Notes (Signed)
Pt to ED via POV stating that he is having an allergic reaction. Pt states that he has allergies to several different things and is unsure what he is reacting to. Pt states that he first noticed hives on his thighs this morning. Pt only has hives on this upper legs and feet. Pt states that he has used his inhaler and that he took 50 mg of Benadryl about 2 hours ago. Pt is in NAD.

## 2021-02-19 NOTE — ED Notes (Signed)
Pt states chest pain getting worse- will repeat EKG

## 2021-08-01 ENCOUNTER — Other Ambulatory Visit: Payer: Self-pay | Admitting: Orthopedic Surgery

## 2021-08-01 ENCOUNTER — Other Ambulatory Visit: Payer: Self-pay

## 2021-08-01 ENCOUNTER — Ambulatory Visit
Admission: RE | Admit: 2021-08-01 | Discharge: 2021-08-01 | Disposition: A | Discharge status: Home / Self Care | Payer: No Typology Code available for payment source | Source: Ambulatory Visit | Attending: Orthopedic Surgery | Admitting: Orthopedic Surgery

## 2021-08-01 DIAGNOSIS — R2241 Localized swelling, mass and lump, right lower limb: Secondary | ICD-10-CM | POA: Insufficient documentation

## 2022-04-24 ENCOUNTER — Ambulatory Visit (INDEPENDENT_AMBULATORY_CARE_PROVIDER_SITE_OTHER): Payer: No Typology Code available for payment source

## 2022-04-24 ENCOUNTER — Ambulatory Visit (INDEPENDENT_AMBULATORY_CARE_PROVIDER_SITE_OTHER): Payer: No Typology Code available for payment source | Admitting: Podiatry

## 2022-04-24 DIAGNOSIS — M2141 Flat foot [pes planus] (acquired), right foot: Secondary | ICD-10-CM

## 2022-04-24 DIAGNOSIS — M2142 Flat foot [pes planus] (acquired), left foot: Secondary | ICD-10-CM

## 2022-04-24 DIAGNOSIS — E119 Type 2 diabetes mellitus without complications: Secondary | ICD-10-CM

## 2022-04-24 NOTE — Progress Notes (Signed)
   Chief Complaint  Patient presents with   Congential pes planus     Congential pes planus     HPI: 56 y.o. male presenting today for routine diabetic foot exam.  Patient states that he is also developed pain and tenderness associated to the bilateral feet.  He does have history of flatfoot deformity.  He says the orthotics he had from a few years ago have worn out.  He is requesting new orthotics today.  He presents for further treatment and evaluation  Past Medical History:  Diagnosis Date   Asthma    Depression    Hyperlipidemia    Hypertension    Shoulder mass 2017   Past Surgical History:  Procedure Laterality Date   KNEE CARTILAGE SURGERY  2002, 2005   LAPAROSCOPIC SMALL BOWEL RESECTION  2011   LIPOMA EXCISION Left 07/05/2016   Procedure: EXCISION LIPOMA;  Surgeon: Florene Glen, MD;  Location: [facility] ORS;  Service: General;  Laterality: Left;   NASAL SINUS SURGERY  9371   nissenfundoplication  6967   Allergies  Allergen Reactions   Fluticasone Propionate Hives   Other Anaphylaxis and Shortness Of Breath    Dust mite, mold, horses, mice, cats, roaches, grass   Remicade [Infliximab] Anaphylaxis   Singulair [Montelukast Sodium] Other (See Comments)    periphial  swelling   Flonase [Fluticasone Propionate] Hives   Humira [Adalimumab] Hives   Montelukast Other (See Comments)    Other Reaction: Other reaction    Physical Exam: General: The patient is alert and oriented x3 in no acute distress.  Dermatology: Skin is warm, dry and supple bilateral lower extremities. No open wounds noted  Vascular: Palpable pedal pulses bilaterally. No edema or erythema noted. Capillary refill within normal limits.  Neurological: Epicritic and protective threshold diminished bilaterally.   Musculoskeletal Exam: Pes planovalgus deformity noted with weightbearing and collapse of the medial longitudinal arch left greater than the right     Assessment: 1.  DM type II; controlled 2.   Pes planus flatfoot deformity both   Plan of Care:  1. Patient evaluated. 2.  Comprehensive diabetic foot exam performed today 3.  Continue management of blood glucose levels as per PCP 4.  Continue Lyrica as per PCP 5.  Prescription for custom molded orthotics provided for the patient to take to [facility] lab pending approval from the [place] 6.  Return to clinic annually      Edrick Kins, DPM [facility]  Dr. Edrick Kins, DPM    [address], [place] [address]                Office [phone]  Fax [phone]

## 2022-09-18 ENCOUNTER — Other Ambulatory Visit: Payer: Self-pay | Admitting: Orthopedic Surgery

## 2022-09-18 DIAGNOSIS — Z96651 Presence of right artificial knee joint: Secondary | ICD-10-CM

## 2022-09-18 DIAGNOSIS — M25461 Effusion, right knee: Secondary | ICD-10-CM

## 2022-09-26 ENCOUNTER — Encounter
Admission: RE | Admit: 2022-09-26 | Discharge: 2022-09-26 | Disposition: A | Discharge status: Home / Self Care | Payer: No Typology Code available for payment source | Source: Ambulatory Visit | Attending: Orthopedic Surgery | Admitting: Orthopedic Surgery

## 2022-09-26 DIAGNOSIS — Z96651 Presence of right artificial knee joint: Secondary | ICD-10-CM | POA: Insufficient documentation

## 2022-09-26 DIAGNOSIS — M25461 Effusion, right knee: Secondary | ICD-10-CM | POA: Diagnosis present

## 2022-09-26 MED ORDER — TECHNETIUM TC 99M MEDRONATE IV KIT
20.0000 | PACK | Freq: Once | INTRAVENOUS | Status: AC | PRN
  Administered 2022-09-26: 20.8 via INTRAVENOUS

## 2023-01-19 ENCOUNTER — Emergency Department
Admission: EM | Admit: 2023-01-19 | Discharge: 2023-01-20 | Disposition: A | Discharge status: Home / Self Care | Payer: No Typology Code available for payment source | Attending: Emergency Medicine | Admitting: Emergency Medicine

## 2023-01-19 DIAGNOSIS — R252 Cramp and spasm: Secondary | ICD-10-CM | POA: Insufficient documentation

## 2023-01-19 LAB — CBC WITH DIFFERENTIAL/PLATELET
Abs Immature Granulocytes: 0.09 10*3/uL — ABNORMAL HIGH (ref 0.00–0.07)
Basophils Absolute: 0.1 10*3/uL (ref 0.0–0.1)
Basophils Relative: 1 %
Eosinophils Absolute: 0.3 10*3/uL (ref 0.0–0.5)
Eosinophils Relative: 3 %
HCT: 35.1 % — ABNORMAL LOW (ref 39.0–52.0)
Hemoglobin: 11.2 g/dL — ABNORMAL LOW (ref 13.0–17.0)
Immature Granulocytes: 1 %
Lymphocytes Relative: 13 %
Lymphs Abs: 1.3 10*3/uL (ref 0.7–4.0)
MCH: 29.8 pg (ref 26.0–34.0)
MCHC: 31.9 g/dL (ref 30.0–36.0)
MCV: 93.4 fL (ref 80.0–100.0)
Monocytes Absolute: 1.7 10*3/uL — ABNORMAL HIGH (ref 0.1–1.0)
Monocytes Relative: 17 %
Neutro Abs: 6.6 10*3/uL (ref 1.7–7.7)
Neutrophils Relative %: 65 %
Platelets: 368 10*3/uL (ref 150–400)
RBC: 3.76 MIL/uL — ABNORMAL LOW (ref 4.22–5.81)
RDW: 13.2 % (ref 11.5–15.5)
WBC: 10 10*3/uL (ref 4.0–10.5)
nRBC: 0 % (ref 0.0–0.2)

## 2023-01-19 LAB — BASIC METABOLIC PANEL
Anion gap: 11 (ref 5–15)
BUN: 17 mg/dL (ref 6–20)
CO2: 26 mmol/L (ref 22–32)
Calcium: 8.3 mg/dL — ABNORMAL LOW (ref 8.9–10.3)
Chloride: 99 mmol/L (ref 98–111)
Creatinine, Ser: 1.03 mg/dL (ref 0.61–1.24)
GFR, Estimated: >60 mL/min (ref >60)
Glucose, Bld: 116 mg/dL — ABNORMAL HIGH (ref 70–99)
Potassium: 4.2 mmol/L (ref 3.5–5.1)
Sodium: 136 mmol/L (ref 135–145)

## 2023-01-19 LAB — MAGNESIUM: Magnesium: 2 mg/dL (ref 1.7–2.4)

## 2023-01-19 MED ORDER — MORPHINE SULFATE (PF) 4 MG/ML IV SOLN
4.0000 mg | Freq: Once | INTRAVENOUS | Status: AC
  Administered 2023-01-19: 4 mg via INTRAVENOUS
  Filled 2023-01-19: qty 1

## 2023-01-19 MED ORDER — SODIUM CHLORIDE 0.9 % IV BOLUS
1000.0000 mL | Freq: Once | INTRAVENOUS | Status: AC
  Administered 2023-01-19: 1000 mL via INTRAVENOUS

## 2023-01-19 MED ORDER — CALCIUM GLUCONATE-NACL 1-0.675 GM/50ML-% IV SOLN
1.0000 g | Freq: Once | INTRAVENOUS | Status: AC
  Administered 2023-01-19: 1000 mg via INTRAVENOUS
  Filled 2023-01-19: qty 50

## 2023-01-19 NOTE — Discharge Instructions (Signed)
Please seek medical attention for any high fevers, chest pain, shortness of breath, change in behavior, persistent vomiting, bloody stool or any other new or concerning symptoms.  

## 2023-01-19 NOTE — ED Provider Notes (Signed)
[facility] Provider Note    Event Date/Time   First MD Initiated Contact with Patient 01/19/23 2155     (approximate)   History   Leg cramps   HPI  Sean Cain is a 57 y.o. male who presents to the emergency department today with primary concern for leg cramping.  Patient states that he recently had surgery on his right knee.  He was put on pain medication.  Feels like the pain medication has affected his appetite so he is not eating as much as he normally would.  He has been drinking.  He states he has had leg cramps in the past.  He tried to make sure he was getting his electrolytes but his leg cramps continued.  Discussed with nurse triage through the orthopedic office is worried about his electrolytes.     Physical Exam   Triage Vital Signs: ED Triage Vitals [01/19/23 2013]  Enc Vitals Group     BP 111/87     Pulse Rate 86     Resp 20     Temp 99.5 F (37.5 C)     Temp Source Oral     SpO2 97 %     Weight      Height      Head Circumference      Peak Flow      Pain Score      Pain Loc      Pain Edu?      Excl. in GC?     Most recent vital signs: Vitals:   01/19/23 2013 01/19/23 2014  BP: 111/87 111/87  Pulse: 86 87  Resp: 20 19  Temp: 99.5 F (37.5 C) 98.1 F (36.7 C)  SpO2: 97% 100%   General: Awake, alert, oriented. CV:  Good peripheral perfusion. Regular rate and rhythm. Resp:  Normal effort. Lungs clear. Abd:  No distention.  Ext:  Right knee incision without surrounding erythema.    ED Results / Procedures / Treatments   Labs (all labs ordered are listed, but only abnormal results are displayed) Labs Reviewed  CBC WITH DIFFERENTIAL/PLATELET - Abnormal; Notable for the following components:      Result Value   RBC 3.76 (*)    Hemoglobin 11.2 (*)    HCT 35.1 (*)    Monocytes Absolute 1.7 (*)    Abs Immature Granulocytes 0.09 (*)    All other components within normal limits  BASIC METABOLIC PANEL -  Abnormal; Notable for the following components:   Glucose, Bld 116 (*)    Calcium 8.3 (*)    All other components within normal limits  MAGNESIUM     EKG  None   RADIOLOGY None  PROCEDURES:  Critical Care performed: No    MEDICATIONS ORDERED IN ED: Medications - No data to display   IMPRESSION / MDM / ASSESSMENT AND PLAN / ED COURSE  I reviewed the triage vital signs and the nursing notes.                              Differential diagnosis includes, but is not limited to, dehydration, electrolyte abnormality  Patient's presentation is most consistent with acute presentation with potential threat to life or bodily function.   Patient presented to the emergency department today because of concerns for leg cramping.  Patient's blood work here without concerning AKI although does show some low calcium level.  Do think patient  might have some dehydration and certainly the hypocalcemia might be playing a role in the muscle cramps.  Patient was given IV fluids and IV calcium here and did feel improvement.  Do think would be reasonable for patient to be discharged home.  FINAL CLINICAL IMPRESSION(S) / ED DIAGNOSES   Final diagnoses:  Leg cramping  Hypocalcemia         Note:  This document was prepared using Dragon voice recognition software and may include unintentional dictation errors.    Phineas Semen, MD 01/19/23 [phone]

## 2023-02-22 IMAGING — US US EXTREM LOW VENOUS*R*
1 series · 14 of 24 positions shown · non-contrast
Comparison: None.

CLINICAL DATA: Right lower extremity swelling

EXAM:
RIGHT LOWER EXTREMITY VENOUS DOPPLER ULTRASOUND
TECHNIQUE: Gray-scale sonography with compression, as well as color and duplex
ultrasound, were performed to evaluate the deep venous system(s)
from the level of the common femoral vein through the popliteal and
proximal calf veins.

[Series 1: us extrem low venous*right* · 0.08mm/px · 14 of 35 slices shown]
[im 1/35]
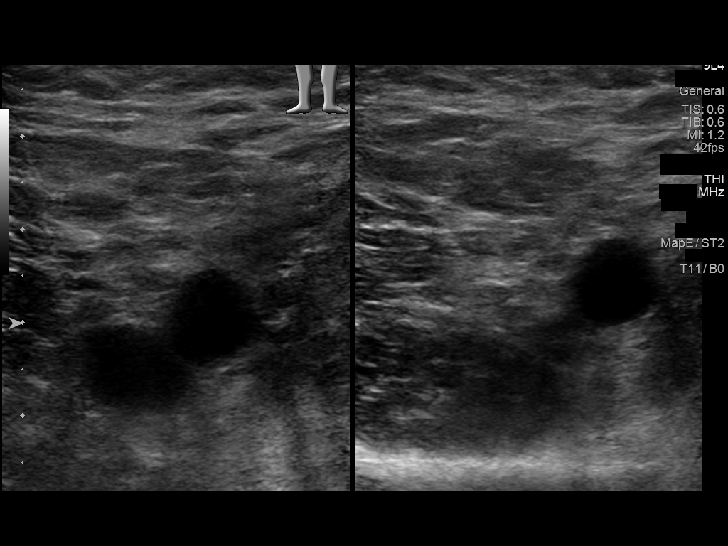
[im 3/35]
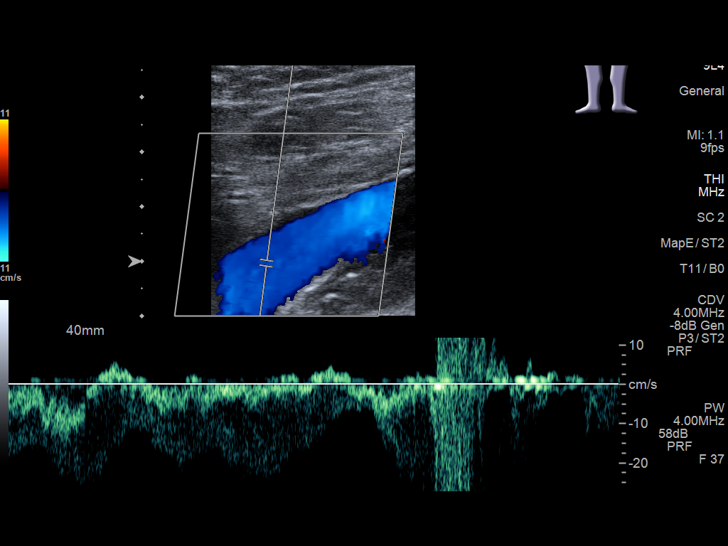
[im 6/35]
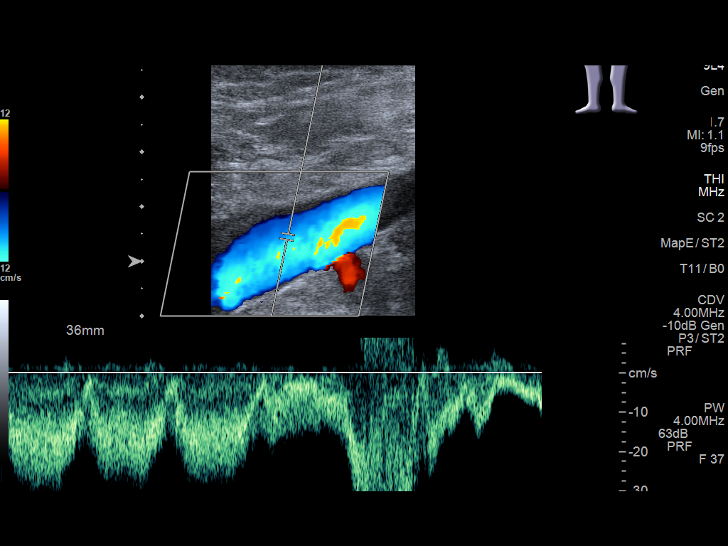
[im 9/35]
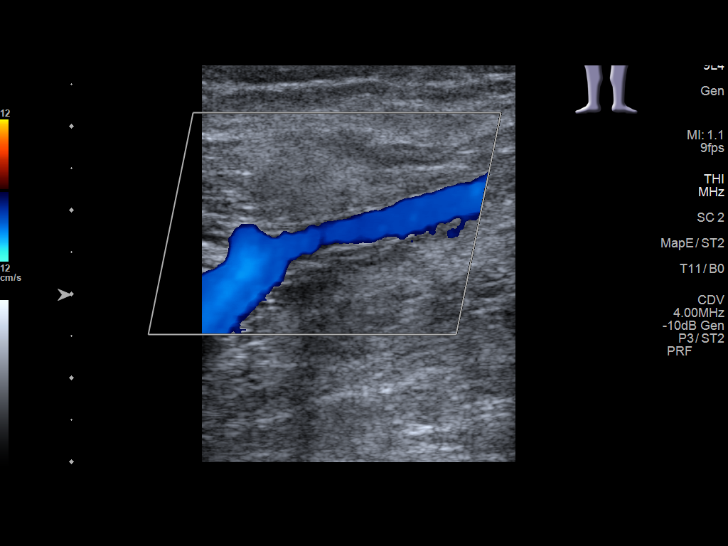
[im 11/35]
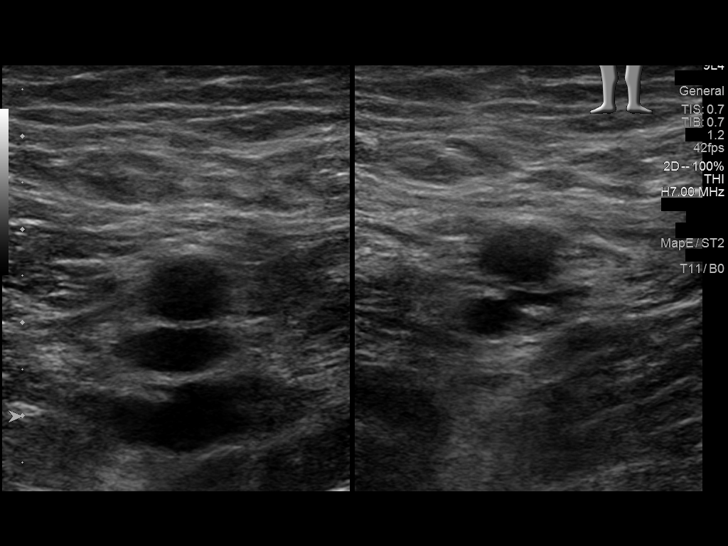
[im 14/35]
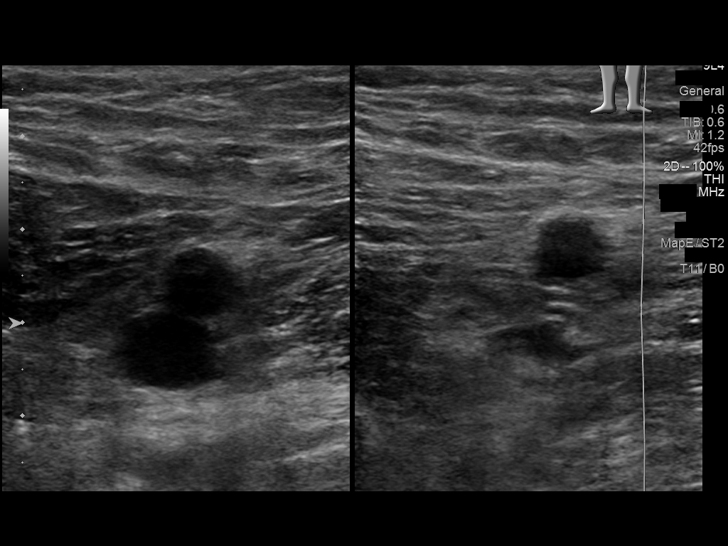
[im 17/35]
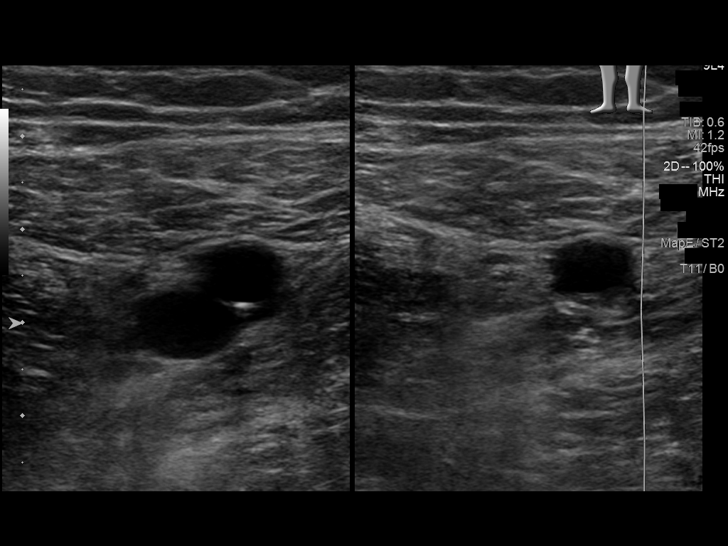
[im 18/35]
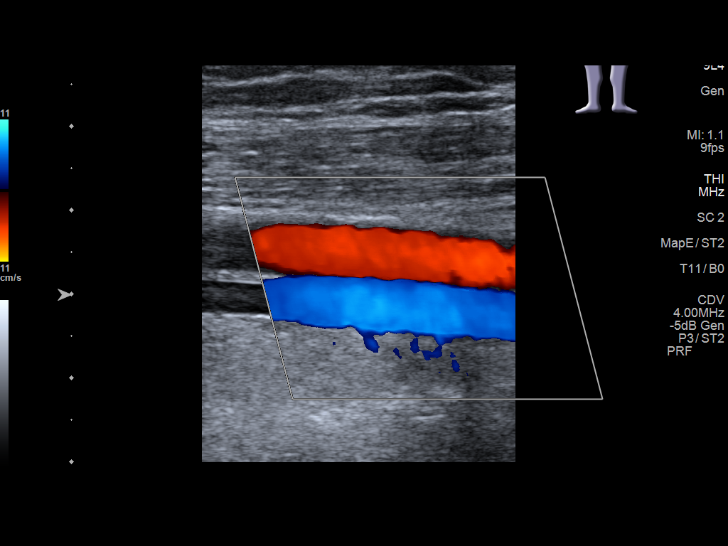
[im 21/35]
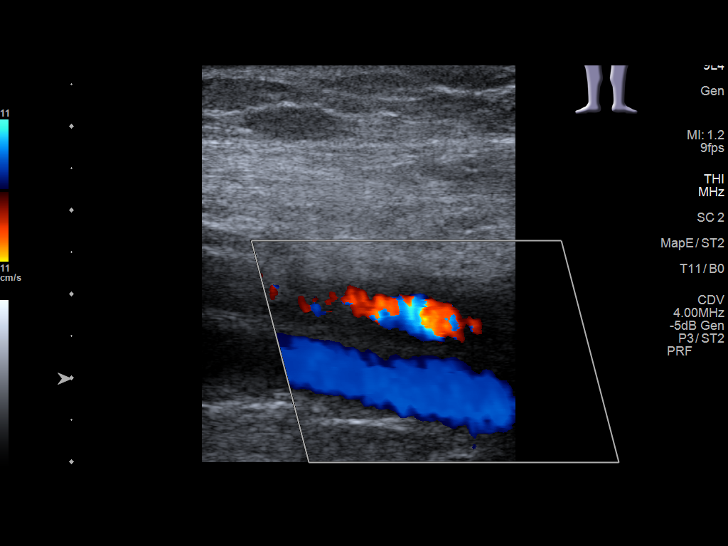
[im 24/35]
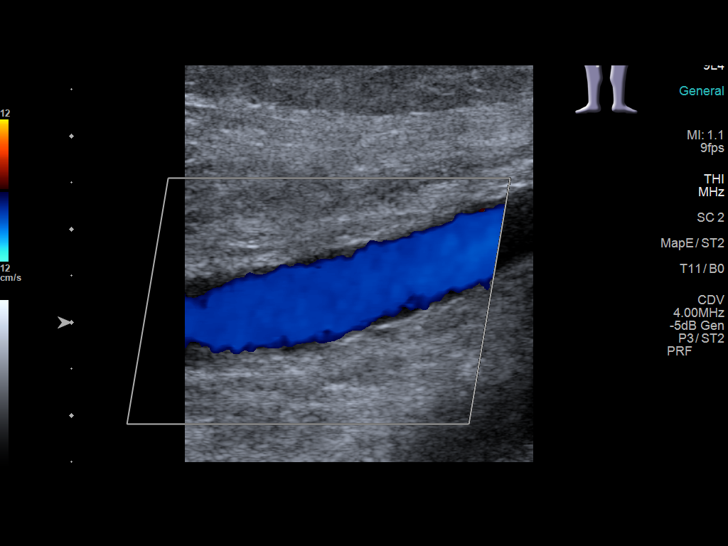
[im 27/35]
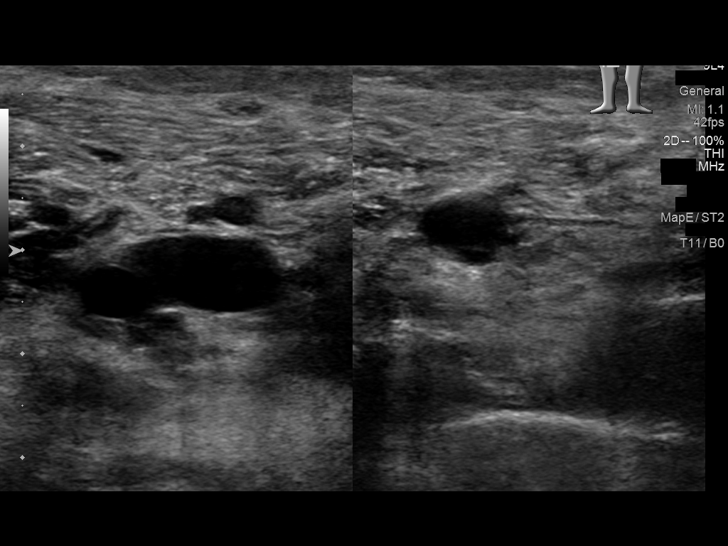
[im 29/35]
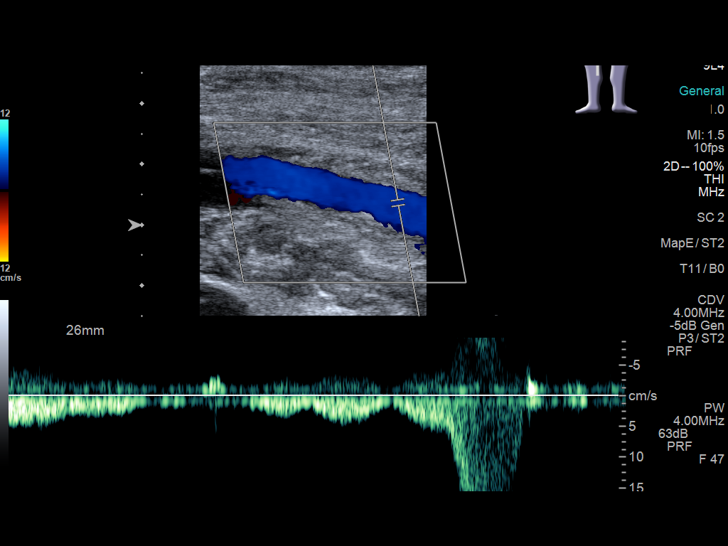
[im 32/35]
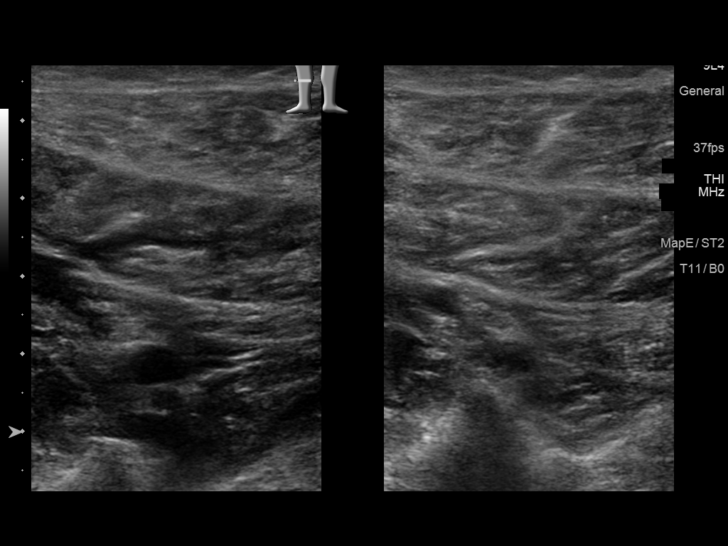
[im 35/35]
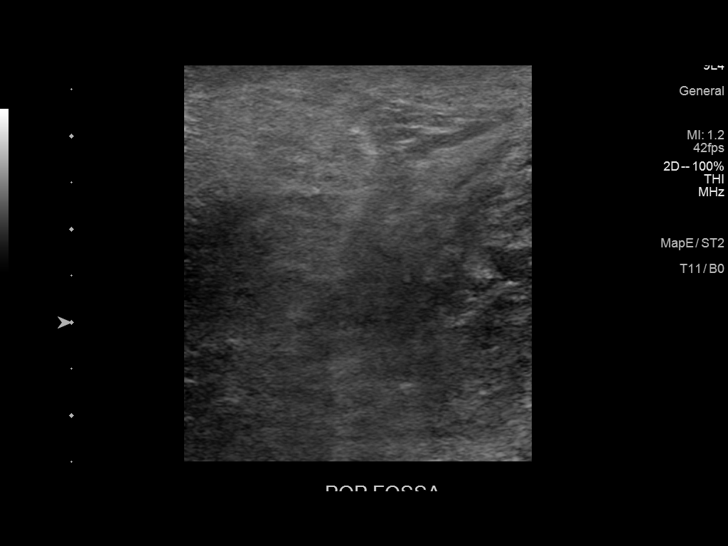

[14 of 24 positions shown; findings below may reference images not displayed]

FINDINGS: VENOUS

Normal compressibility of the common femoral, superficial femoral,
and popliteal veins, as well as the visualized calf veins.
Visualized portions of profunda femoral vein and great saphenous
vein unremarkable. No filling defects to suggest DVT on grayscale or
color Doppler imaging. Doppler waveforms show normal direction of
venous flow, normal respiratory plasticity and response to
augmentation.

Limited views of the contralateral common femoral vein are
unremarkable.

OTHER

None.

Limitations: none
IMPRESSION: Negative.

## 2023-10-08 ENCOUNTER — Other Ambulatory Visit: Payer: Self-pay

## 2023-10-08 DIAGNOSIS — Z8249 Family history of ischemic heart disease and other diseases of the circulatory system: Secondary | ICD-10-CM

## 2023-10-08 DIAGNOSIS — K56609 Unspecified intestinal obstruction, unspecified as to partial versus complete obstruction: Secondary | ICD-10-CM | POA: Diagnosis not present

## 2023-10-08 DIAGNOSIS — Z79899 Other long term (current) drug therapy: Secondary | ICD-10-CM

## 2023-10-08 DIAGNOSIS — Z9889 Other specified postprocedural states: Secondary | ICD-10-CM

## 2023-10-08 DIAGNOSIS — Z9049 Acquired absence of other specified parts of digestive tract: Secondary | ICD-10-CM

## 2023-10-08 DIAGNOSIS — K567 Ileus, unspecified: Secondary | ICD-10-CM | POA: Diagnosis present

## 2023-10-08 DIAGNOSIS — E785 Hyperlipidemia, unspecified: Secondary | ICD-10-CM | POA: Diagnosis present

## 2023-10-08 DIAGNOSIS — K56600 Partial intestinal obstruction, unspecified as to cause: Secondary | ICD-10-CM | POA: Diagnosis not present

## 2023-10-08 DIAGNOSIS — E871 Hypo-osmolality and hyponatremia: Secondary | ICD-10-CM | POA: Diagnosis present

## 2023-10-08 DIAGNOSIS — Z7952 Long term (current) use of systemic steroids: Secondary | ICD-10-CM

## 2023-10-08 DIAGNOSIS — J45909 Unspecified asthma, uncomplicated: Secondary | ICD-10-CM | POA: Diagnosis present

## 2023-10-08 DIAGNOSIS — Z91048 Other nonmedicinal substance allergy status: Secondary | ICD-10-CM

## 2023-10-08 DIAGNOSIS — Z6833 Body mass index (BMI) 33.0-33.9, adult: Secondary | ICD-10-CM

## 2023-10-08 DIAGNOSIS — E669 Obesity, unspecified: Secondary | ICD-10-CM | POA: Diagnosis present

## 2023-10-08 DIAGNOSIS — Z888 Allergy status to other drugs, medicaments and biological substances status: Secondary | ICD-10-CM

## 2023-10-08 DIAGNOSIS — Z789 Other specified health status: Secondary | ICD-10-CM

## 2023-10-08 DIAGNOSIS — Z8719 Personal history of other diseases of the digestive system: Secondary | ICD-10-CM

## 2023-10-08 DIAGNOSIS — Z825 Family history of asthma and other chronic lower respiratory diseases: Secondary | ICD-10-CM

## 2023-10-08 DIAGNOSIS — K219 Gastro-esophageal reflux disease without esophagitis: Secondary | ICD-10-CM | POA: Diagnosis present

## 2023-10-08 DIAGNOSIS — Z87891 Personal history of nicotine dependence: Secondary | ICD-10-CM

## 2023-10-08 DIAGNOSIS — R140 Abdominal distension (gaseous): Secondary | ICD-10-CM | POA: Diagnosis present

## 2023-10-08 DIAGNOSIS — E876 Hypokalemia: Secondary | ICD-10-CM | POA: Diagnosis present

## 2023-10-08 DIAGNOSIS — Z820 Family history of epilepsy and other diseases of the nervous system: Secondary | ICD-10-CM

## 2023-10-08 DIAGNOSIS — Z87892 Personal history of anaphylaxis: Secondary | ICD-10-CM

## 2023-10-08 DIAGNOSIS — R7989 Other specified abnormal findings of blood chemistry: Secondary | ICD-10-CM | POA: Diagnosis present

## 2023-10-08 DIAGNOSIS — D849 Immunodeficiency, unspecified: Secondary | ICD-10-CM | POA: Diagnosis present

## 2023-10-08 DIAGNOSIS — I10 Essential (primary) hypertension: Secondary | ICD-10-CM | POA: Diagnosis present

## 2023-10-08 DIAGNOSIS — K5090 Crohn's disease, unspecified, without complications: Secondary | ICD-10-CM | POA: Diagnosis present

## 2023-10-08 DIAGNOSIS — B974 Respiratory syncytial virus as the cause of diseases classified elsewhere: Secondary | ICD-10-CM | POA: Diagnosis present

## 2023-10-08 LAB — CBC WITH DIFFERENTIAL/PLATELET
Abs Immature Granulocytes: 0.07 10*3/uL (ref 0.00–0.07)
Basophils Absolute: 0 10*3/uL (ref 0.0–0.1)
Basophils Relative: 0 %
Eosinophils Absolute: 0.1 10*3/uL (ref 0.0–0.5)
Eosinophils Relative: 1 %
HCT: 50.9 % (ref 39.0–52.0)
Hemoglobin: 16.8 g/dL (ref 13.0–17.0)
Immature Granulocytes: 1 %
Lymphocytes Relative: 9 %
Lymphs Abs: 0.8 10*3/uL (ref 0.7–4.0)
MCH: 31 pg (ref 26.0–34.0)
MCHC: 33 g/dL (ref 30.0–36.0)
MCV: 93.9 fL (ref 80.0–100.0)
Monocytes Absolute: 1.4 10*3/uL — ABNORMAL HIGH (ref 0.1–1.0)
Monocytes Relative: 16 %
Neutro Abs: 6.1 10*3/uL (ref 1.7–7.7)
Neutrophils Relative %: 73 %
Platelets: 206 10*3/uL (ref 150–400)
RBC: 5.42 MIL/uL (ref 4.22–5.81)
RDW: 14.2 % (ref 11.5–15.5)
WBC: 8.5 10*3/uL (ref 4.0–10.5)
nRBC: 0 % (ref 0.0–0.2)

## 2023-10-08 MED ORDER — ONDANSETRON HCL 4 MG/2ML IJ SOLN
4.0000 mg | Freq: Once | INTRAMUSCULAR | Status: AC
  Administered 2023-10-08: 4 mg via INTRAVENOUS
  Filled 2023-10-08: qty 2

## 2023-10-08 NOTE — ED Triage Notes (Signed)
Pt arrives via [facility] with CC of abd pain with diarrhea and fever since Sunday night (two days). Pt denies nausea but reports feeling of fullness with increased salivation. Pt reports hx of Chron's with known stable partial bowel blockage. Pt extremely uncomfortable in triage.

## 2023-10-09 ENCOUNTER — Inpatient Hospital Stay
Admission: EM | Admit: 2023-10-09 | Discharge: 2023-10-10 | DRG: 389 | Disposition: A | Discharge status: Home / Self Care | Payer: No Typology Code available for payment source | Attending: Internal Medicine | Admitting: Internal Medicine

## 2023-10-09 ENCOUNTER — Emergency Department: Payer: No Typology Code available for payment source

## 2023-10-09 ENCOUNTER — Encounter: Payer: Self-pay | Admitting: Emergency Medicine

## 2023-10-09 DIAGNOSIS — B338 Other specified viral diseases: Secondary | ICD-10-CM

## 2023-10-09 DIAGNOSIS — Z789 Other specified health status: Secondary | ICD-10-CM | POA: Diagnosis not present

## 2023-10-09 DIAGNOSIS — D849 Immunodeficiency, unspecified: Secondary | ICD-10-CM | POA: Diagnosis present

## 2023-10-09 DIAGNOSIS — K56609 Unspecified intestinal obstruction, unspecified as to partial versus complete obstruction: Secondary | ICD-10-CM | POA: Diagnosis present

## 2023-10-09 DIAGNOSIS — Z9049 Acquired absence of other specified parts of digestive tract: Secondary | ICD-10-CM | POA: Diagnosis not present

## 2023-10-09 DIAGNOSIS — Z9889 Other specified postprocedural states: Secondary | ICD-10-CM | POA: Diagnosis not present

## 2023-10-09 DIAGNOSIS — K219 Gastro-esophageal reflux disease without esophagitis: Secondary | ICD-10-CM | POA: Diagnosis present

## 2023-10-09 DIAGNOSIS — Z9189 Other specified personal risk factors, not elsewhere classified: Secondary | ICD-10-CM

## 2023-10-09 DIAGNOSIS — Z825 Family history of asthma and other chronic lower respiratory diseases: Secondary | ICD-10-CM | POA: Diagnosis not present

## 2023-10-09 DIAGNOSIS — Z7952 Long term (current) use of systemic steroids: Secondary | ICD-10-CM | POA: Diagnosis not present

## 2023-10-09 DIAGNOSIS — I10 Essential (primary) hypertension: Secondary | ICD-10-CM | POA: Insufficient documentation

## 2023-10-09 DIAGNOSIS — R7989 Other specified abnormal findings of blood chemistry: Secondary | ICD-10-CM | POA: Diagnosis present

## 2023-10-09 DIAGNOSIS — R140 Abdominal distension (gaseous): Secondary | ICD-10-CM | POA: Diagnosis present

## 2023-10-09 DIAGNOSIS — K5090 Crohn's disease, unspecified, without complications: Secondary | ICD-10-CM | POA: Diagnosis present

## 2023-10-09 DIAGNOSIS — E871 Hypo-osmolality and hyponatremia: Secondary | ICD-10-CM | POA: Diagnosis present

## 2023-10-09 DIAGNOSIS — R1084 Generalized abdominal pain: Secondary | ICD-10-CM

## 2023-10-09 DIAGNOSIS — B974 Respiratory syncytial virus as the cause of diseases classified elsewhere: Secondary | ICD-10-CM | POA: Diagnosis present

## 2023-10-09 DIAGNOSIS — K567 Ileus, unspecified: Secondary | ICD-10-CM | POA: Diagnosis present

## 2023-10-09 DIAGNOSIS — Z888 Allergy status to other drugs, medicaments and biological substances status: Secondary | ICD-10-CM | POA: Diagnosis not present

## 2023-10-09 DIAGNOSIS — K56600 Partial intestinal obstruction, unspecified as to cause: Secondary | ICD-10-CM | POA: Diagnosis present

## 2023-10-09 DIAGNOSIS — E876 Hypokalemia: Secondary | ICD-10-CM | POA: Diagnosis present

## 2023-10-09 DIAGNOSIS — E669 Obesity, unspecified: Secondary | ICD-10-CM | POA: Diagnosis present

## 2023-10-09 DIAGNOSIS — E785 Hyperlipidemia, unspecified: Secondary | ICD-10-CM | POA: Diagnosis present

## 2023-10-09 DIAGNOSIS — Z820 Family history of epilepsy and other diseases of the nervous system: Secondary | ICD-10-CM | POA: Diagnosis not present

## 2023-10-09 DIAGNOSIS — Z8249 Family history of ischemic heart disease and other diseases of the circulatory system: Secondary | ICD-10-CM | POA: Diagnosis not present

## 2023-10-09 DIAGNOSIS — Z79899 Other long term (current) drug therapy: Secondary | ICD-10-CM | POA: Diagnosis not present

## 2023-10-09 DIAGNOSIS — J45909 Unspecified asthma, uncomplicated: Secondary | ICD-10-CM | POA: Diagnosis present

## 2023-10-09 DIAGNOSIS — Z87891 Personal history of nicotine dependence: Secondary | ICD-10-CM | POA: Diagnosis not present

## 2023-10-09 LAB — RESP PANEL BY RT-PCR (RSV, FLU A&B, COVID)  RVPGX2
Influenza A by PCR: NEGATIVE
Influenza B by PCR: NEGATIVE
Resp Syncytial Virus by PCR: POSITIVE — AB
SARS Coronavirus 2 by RT PCR: NEGATIVE

## 2023-10-09 LAB — CBC
HCT: 40.1 % (ref 39.0–52.0)
Hemoglobin: 13.3 g/dL (ref 13.0–17.0)
MCH: 30.6 pg (ref 26.0–34.0)
MCHC: 33.2 g/dL (ref 30.0–36.0)
MCV: 92.4 fL (ref 80.0–100.0)
Platelets: 168 10*3/uL (ref 150–400)
RBC: 4.34 MIL/uL (ref 4.22–5.81)
RDW: 14.1 % (ref 11.5–15.5)
WBC: 5.3 10*3/uL (ref 4.0–10.5)
nRBC: 0 % (ref 0.0–0.2)

## 2023-10-09 LAB — LIPASE, BLOOD: Lipase: 38 U/L (ref 11–51)

## 2023-10-09 LAB — COMPREHENSIVE METABOLIC PANEL
ALT: 26 U/L (ref 0–44)
AST: 24 U/L (ref 15–41)
Albumin: 4 g/dL (ref 3.5–5.0)
Alkaline Phosphatase: 79 U/L (ref 38–126)
Anion gap: 8 (ref 5–15)
BUN: 13 mg/dL (ref 6–20)
CO2: 16 mmol/L — ABNORMAL LOW (ref 22–32)
Calcium: 9.1 mg/dL (ref 8.9–10.3)
Chloride: 105 mmol/L (ref 98–111)
Creatinine, Ser: 1.35 mg/dL — ABNORMAL HIGH (ref 0.61–1.24)
GFR, Estimated: >60 mL/min (ref >60)
Glucose, Bld: 113 mg/dL — ABNORMAL HIGH (ref 70–99)
Potassium: 3.4 mmol/L — ABNORMAL LOW (ref 3.5–5.1)
Sodium: 129 mmol/L — ABNORMAL LOW (ref 135–145)
Total Bilirubin: 0.6 mg/dL (ref 0.0–1.2)
Total Protein: 7.6 g/dL (ref 6.5–8.1)

## 2023-10-09 LAB — URINALYSIS, ROUTINE W REFLEX MICROSCOPIC
Bacteria, UA: NONE SEEN
Bilirubin Urine: NEGATIVE
Glucose, UA: NEGATIVE mg/dL
Ketones, ur: NEGATIVE mg/dL
Leukocytes,Ua: NEGATIVE
Nitrite: NEGATIVE
Protein, ur: 100 mg/dL — AB
Specific Gravity, Urine: 1.027 (ref 1.005–1.030)
Squamous Epithelial / HPF: 0 /[HPF] (ref 0–5)
pH: 5 (ref 5.0–8.0)

## 2023-10-09 LAB — CREATININE, SERUM
Creatinine, Ser: 1 mg/dL (ref 0.61–1.24)
GFR, Estimated: >60 mL/min (ref >60)

## 2023-10-09 LAB — LACTIC ACID, PLASMA
Lactic Acid, Venous: 1.8 mmol/L (ref 0.5–1.9)
Lactic Acid, Venous: 1.5 mmol/L (ref 0.5–1.9)

## 2023-10-09 LAB — HIV ANTIBODY (ROUTINE TESTING W REFLEX): HIV Screen 4th Generation wRfx: NONREACTIVE

## 2023-10-09 MED ORDER — METRONIDAZOLE 500 MG/100ML IV SOLN
500.0000 mg | Freq: Once | INTRAVENOUS | Status: AC
  Administered 2023-10-09: 500 mg via INTRAVENOUS
  Filled 2023-10-09: qty 100

## 2023-10-09 MED ORDER — HYDROMORPHONE HCL 1 MG/ML IJ SOLN
1.0000 mg | Freq: Once | INTRAMUSCULAR | Status: AC
  Administered 2023-10-09: 1 mg via INTRAVENOUS
  Filled 2023-10-09: qty 1

## 2023-10-09 MED ORDER — HYDROMORPHONE HCL 2 MG PO TABS
1.0000 mg | ORAL_TABLET | ORAL | Status: DC | PRN
  Administered 2023-10-10: 1 mg via ORAL
  Filled 2023-10-09: qty 1

## 2023-10-09 MED ORDER — SODIUM CHLORIDE 0.9 % IV SOLN
2.0000 g | Freq: Once | INTRAVENOUS | Status: AC
  Administered 2023-10-09: 2 g via INTRAVENOUS
  Filled 2023-10-09: qty 12.5

## 2023-10-09 MED ORDER — LORAZEPAM 2 MG/ML IJ SOLN: 0.5000 mg | Freq: Once | INTRAMUSCULAR | Status: DC | PRN

## 2023-10-09 MED ORDER — IOHEXOL 350 MG/ML SOLN
100.0000 mL | Freq: Once | INTRAVENOUS | Status: AC | PRN
  Administered 2023-10-09: 100 mL via INTRAVENOUS

## 2023-10-09 MED ORDER — ALPRAZOLAM 0.25 MG PO TABS
0.2500 mg | ORAL_TABLET | Freq: Once | ORAL | Status: AC | PRN
  Administered 2023-10-09: 0.25 mg via ORAL
  Filled 2023-10-09: qty 1

## 2023-10-09 MED ORDER — ENOXAPARIN SODIUM 60 MG/0.6ML IJ SOSY
55.0000 mg | PREFILLED_SYRINGE | INTRAMUSCULAR | Status: DC
  Administered 2023-10-09 – 2023-10-10 (×2): 55 mg via SUBCUTANEOUS
  Filled 2023-10-09 (×2): qty 0.6

## 2023-10-09 MED ORDER — SODIUM CHLORIDE 0.9 % IV SOLN
INTRAVENOUS | Status: DC
Start: 2023-10-09 — End: 2023-10-10

## 2023-10-09 MED ORDER — ONDANSETRON HCL 4 MG PO TABS: 4.0000 mg | ORAL_TABLET | Freq: Four times a day (QID) | ORAL | Status: DC | PRN

## 2023-10-09 MED ORDER — ACETAMINOPHEN 325 MG PO TABS: 650.0000 mg | ORAL_TABLET | Freq: Four times a day (QID) | ORAL | Status: DC | PRN

## 2023-10-09 MED ORDER — LACTATED RINGERS IV SOLN: INTRAVENOUS | Status: AC

## 2023-10-09 MED ORDER — ONDANSETRON HCL 4 MG/2ML IJ SOLN
4.0000 mg | Freq: Once | INTRAMUSCULAR | Status: AC
  Administered 2023-10-09: 4 mg via INTRAVENOUS
  Filled 2023-10-09: qty 2

## 2023-10-09 MED ORDER — OXYCODONE-ACETAMINOPHEN 5-325 MG PO TABS
1.0000 | ORAL_TABLET | Freq: Four times a day (QID) | ORAL | Status: DC | PRN
  Administered 2023-10-09: 2 via ORAL
  Administered 2023-10-10: 1 via ORAL
  Filled 2023-10-09 (×2): qty 2

## 2023-10-09 MED ORDER — LISINOPRIL 20 MG PO TABS
20.0000 mg | ORAL_TABLET | Freq: Every day | ORAL | Status: DC
  Administered 2023-10-09 – 2023-10-10 (×2): 20 mg via ORAL
  Filled 2023-10-09 (×2): qty 1

## 2023-10-09 MED ORDER — ACETAMINOPHEN 650 MG RE SUPP: 650.0000 mg | Freq: Four times a day (QID) | RECTAL | Status: DC | PRN

## 2023-10-09 MED ORDER — PREGABALIN 50 MG PO CAPS
100.0000 mg | ORAL_CAPSULE | Freq: Once | ORAL | Status: AC
Start: 2023-10-10 — End: 2023-10-10
  Administered 2023-10-10: 100 mg via ORAL
  Filled 2023-10-09: qty 2

## 2023-10-09 MED ORDER — ONDANSETRON HCL 4 MG/2ML IJ SOLN
4.0000 mg | Freq: Four times a day (QID) | INTRAMUSCULAR | Status: DC | PRN
  Administered 2023-10-09 – 2023-10-10 (×2): 4 mg via INTRAVENOUS
  Filled 2023-10-09 (×2): qty 2

## 2023-10-09 MED ORDER — HYDROMORPHONE HCL 1 MG/ML IJ SOLN
0.5000 mg | INTRAMUSCULAR | Status: DC | PRN
  Administered 2023-10-09: 1 mg via INTRAVENOUS
  Administered 2023-10-09: 0.5 mg via INTRAVENOUS
  Administered 2023-10-09 (×2): 1 mg via INTRAVENOUS
  Filled 2023-10-09 (×4): qty 1

## 2023-10-09 MED ORDER — TRAZODONE HCL 100 MG PO TABS
300.0000 mg | ORAL_TABLET | Freq: Once | ORAL | Status: AC
  Administered 2023-10-10: 300 mg via ORAL
  Filled 2023-10-09: qty 3

## 2023-10-09 MED ORDER — LACTATED RINGERS IV BOLUS (SEPSIS)
1000.0000 mL | Freq: Once | INTRAVENOUS | Status: AC
  Administered 2023-10-09: 1000 mL via INTRAVENOUS

## 2023-10-09 NOTE — ED Provider Notes (Signed)
[facility] Provider Note    Event Date/Time   First MD Initiated Contact with Patient 10/09/23 0023     (approximate)   History   Abdominal Pain   HPI  Sean Cain is a 58 y.o. male with history of hypertension, hyperlipidemia, asthma, Crohn's disease who is on Skyrizi every 6 weeks, azathioprine and prednisone who presents to the emergency department with several days of severe abdominal pain that radiates into his back, nausea, vomiting, diarrhea.  No bloody stools, melena.  No dysuria or hematuria.  His gastroenterologist is at the [place].  He reports he did have a small bowel movement prior to arrival but is not passing gas and feels distended.  Reports subjective fevers at home.  No cough, congestion, sore throat, ear pain.  Patient reports he has had a Niesen's fundoplication, laparoscopic small bowel resection.    History provided by patient, wife.    Past Medical History:  Diagnosis Date   Asthma    Depression    Hyperlipidemia    Hypertension    Shoulder mass 2017    Past Surgical History:  Procedure Laterality Date   KNEE CARTILAGE SURGERY  2002, 2005   LAPAROSCOPIC SMALL BOWEL RESECTION  2011   LIPOMA EXCISION Left 07/05/2016   Procedure: EXCISION LIPOMA;  Surgeon: Lattie Haw, MD;  Location: [facility] ORS;  Service: General;  Laterality: Left;   NASAL SINUS SURGERY  2006   nissenfundoplication  2000    MEDICATIONS:  Prior to Admission medications   Medication Sig Start Date End Date Taking? Authorizing Provider  acetaminophen (TYLENOL) 325 MG tablet Take 325 mg by mouth 5 (five) times daily. 08/09/20   [provider]  ANTI-ITCH lotion Apply 1 application topically 2 (two) times daily. 04/11/20   [provider]  atorvastatin (LIPITOR) 40 MG tablet Take 20 mg by mouth at bedtime.    [provider]  calcipotriene (DOVONOX) 0.005 % cream Apply 1 application topically every morning. 08/29/20   [provider]  Calcium Carb-Cholecalciferol (CALCIUM CARBONATE-VITAMIN D3) 600-400 MG-UNIT TABS Take 1 tablet by mouth daily.    [provider]  cloNIDine (CATAPRES) 0.1 MG tablet Take 0.1 mg by mouth 2 (two) times daily. 04/22/20   [provider]  cyanocobalamin (,VITAMIN B-12,) 1000 MCG/ML injection Inject 1,000 mcg into the muscle every 30 (thirty) days. 07/18/20   [provider]  cyclobenzaprine (FLEXERIL) 10 MG tablet Take 10 mg by mouth at bedtime.    [provider]  diclofenac Sodium (VOLTAREN) 1 % GEL Apply 4 g topically in the morning, at noon, in the evening, and at bedtime. 05/21/20   [provider]  DULoxetine (CYMBALTA) 30 MG capsule Take 30 mg by mouth daily. 06/23/20   [provider]  EPINEPHrine 0.3 mg/0.3 mL IJ SOAJ injection Inject 0.3 mg into the muscle as needed for anaphylaxis.    [provider]  fluorouracil (EFUDEX) 5 % cream Apply 1 application topically 2 (two) times daily. 08/29/20   [provider]  folic acid (FOLVITE) 1 MG tablet Take 1 mg by mouth daily.    [provider]  Glucagon, rDNA, (GLUCAGON EMERGENCY) 1 MG KIT Inject 1 mg into the muscle once. 06/21/20   [provider]  hydrochlorothiazide (HYDRODIURIL) 25 MG tablet Take 12.5 mg by mouth every morning. 06/21/20   [provider]  Ipratropium-Albuterol (COMBIVENT RESPIMAT) 20-100 MCG/ACT AERS respimat Inhale 1 puff into the lungs every 6 (six) hours.  [provider]  lisinopril (PRINIVIL,ZESTRIL) 20 MG tablet Take 10 mg by mouth daily.    [provider]  loperamide (IMODIUM) 2 MG capsule Take 2 mg by mouth as needed for diarrhea or loose stools.    [provider]  methotrexate 50 MG/2ML injection Inject 25 mg into the skin once a week. 05/21/20   [provider]  omeprazole (PRILOSEC) 40 MG capsule Take 40 mg by mouth daily. 07/18/20   [provider]  predniSONE  (DELTASONE) 10 MG tablet Take 25 mg by mouth daily with breakfast.    [provider]  pregabalin (LYRICA) 100 MG capsule Take 100-200 mg by mouth 2 (two) times daily. 200 mg every morning, 100 mg at bedtime    [provider]  STELARA 90 MG/ML SOSY injection Inject 1 mL into the skin every 28 (twenty-eight) days. 06/21/20   [provider]  traMADol (ULTRAM) 50 MG tablet Take 50 mg by mouth 2 (two) times daily as needed. 08/23/20   [provider]  traZODone (DESYREL) 100 MG tablet Take 200 mg by mouth at bedtime. 06/21/20   [provider]  Vitamin D, Ergocalciferol, (DRISDOL) 1.25 MG (50000 UNIT) CAPS capsule Take 50,000 Units by mouth every 7 (seven) days. Monday    [provider]    Physical Exam   Triage Vital Signs: ED Triage Vitals  Encounter Vitals Group     BP 10/08/23 2307 (!) 124/93     Systolic BP Percentile --      Diastolic BP Percentile --      Pulse Rate 10/08/23 2307 (!) 107     Resp 10/08/23 2307 (!) 22     Temp 10/08/23 2307 99.6 F (37.6 C)     Temp Source 10/08/23 2307 Oral     SpO2 10/08/23 2307 95 %     Weight 10/08/23 2302 253 lb (114.8 kg)     Height 10/08/23 2302 6\' 1"  (1.854 m)     Head Circumference --      Peak Flow --      Pain Score 10/08/23 2302 7     Pain Loc --      Pain Education --      Exclude from Growth Chart --     Most recent vital signs: Vitals:   10/08/23 2307  BP: (!) 124/93  Pulse: (!) 107  Resp: (!) 22  Temp: 99.6 F (37.6 C)  SpO2: 95%    CONSTITUTIONAL: Alert, responds appropriately to questions.  Appears uncomfortable HEAD: Normocephalic, atraumatic EYES: Conjunctivae clear, pupils appear equal, sclera nonicteric ENT: normal nose; moist mucous membranes NECK: Supple, normal ROM CARD: Regular and tachycardic; S1 and S2 appreciated RESP: Slightly tachypneic, breath sounds clear and equal bilaterally; no wheezes, no rhonchi, no rales, no hypoxia or respiratory distress,  speaking full sentences ABD/GI: Distended abdomen, tender to palpation diffusely, soft, no guarding or rebound BACK: The back appears normal EXT: Normal ROM in all joints; no deformity noted, no edema SKIN: Normal color for age and race; warm; no rash on exposed skin NEURO: Moves all extremities equally, normal speech PSYCH: The patient's mood and manner are appropriate.   ED Results / Procedures / Treatments   LABS: (all labs ordered are listed, but only abnormal results are displayed) Labs Reviewed  RESP PANEL BY RT-PCR (RSV, FLU A&B, COVID)  RVPGX2 - Abnormal; Notable for the following components:      Result Value   Resp Syncytial Virus by PCR POSITIVE (*)  All other components within normal limits  CBC WITH DIFFERENTIAL/PLATELET - Abnormal; Notable for the following components:   Monocytes Absolute 1.4 (*)    All other components within normal limits  COMPREHENSIVE METABOLIC PANEL - Abnormal; Notable for the following components:   Sodium 129 (*)    Potassium 3.4 (*)    CO2 16 (*)    Glucose, Bld 113 (*)    Creatinine, Ser 1.35 (*)    All other components within normal limits  URINALYSIS, ROUTINE W REFLEX MICROSCOPIC - Abnormal; Notable for the following components:   Color, Urine YELLOW (*)    APPearance HAZY (*)    Hgb urine dipstick MODERATE (*)    Protein, ur 100 (*)    All other components within normal limits  CULTURE, BLOOD (ROUTINE X 2)  CULTURE, BLOOD (ROUTINE X 2)  LACTIC ACID, PLASMA  LIPASE, BLOOD  LACTIC ACID, PLASMA     EKG:   RADIOLOGY: My personal review and interpretation of imaging: CT abdomen pelvis shows developing partial small bowel obstruction.  I have personally reviewed all radiology reports.   CT ABDOMEN PELVIS W CONTRAST Result Date: 10/09/2023 CLINICAL DATA:  Abdominal pain with diarrhea and fever EXAM: CT ABDOMEN AND PELVIS WITH CONTRAST TECHNIQUE: Multidetector CT imaging of the abdomen and pelvis was performed using the standard  protocol following bolus administration of intravenous contrast. RADIATION DOSE REDUCTION: This exam was performed according to the departmental dose-optimization program which includes automated exposure control, adjustment of the mA and/or kV according to patient size and/or use of iterative reconstruction technique. CONTRAST:  OMNIPAQUE IOHEXOL 350 MG/ML SOLN COMPARISON:  None Available. FINDINGS: Lower chest: No acute abnormality. Hepatobiliary: Hepatic steatosis. Unremarkable gallbladder and biliary tree. Pancreas: Stable. Spleen: Unremarkable. Adrenals/Urinary Tract: Normal adrenal glands. No urinary calculi or hydronephrosis. Unremarkable bladder. Stomach/Bowel: Postoperative change at the gastroesophageal junction. Partial small bowel resection with neoterminal ileum in the right lower quadrant. The small bowel is dilated upstream from the anastomosis. No definite transition point or stricture. Liquid stool throughout the colon. Vascular/Lymphatic: No significant vascular findings are present. No enlarged abdominal or pelvic lymph nodes. Reproductive: Unremarkable. Other: No free intraperitoneal fluid or air. Musculoskeletal: No acute fracture. IMPRESSION: 1. Partial small bowel resection with ileocolic anastomosis in the right lower quadrant. The small bowel is dilated upstream from the anastomosis without definite transition point or stricture. Findings may represent ileus or partial obstruction. 2. Liquid stool throughout the colon consistent with diarrheal illness. 3. Hepatic steatosis. Electronically Signed   By: Minerva Fester M.D.   On: 10/09/2023 02:12     PROCEDURES:  Critical Care performed: Yes, see critical care procedure note(s)   CRITICAL CARE Performed by: Baxter Hire Deidrea Gaetz   Total critical care time: 35 minutes  Critical care time was exclusive of separately billable procedures and treating other patients.  Critical care was necessary to treat or prevent imminent or  life-threatening deterioration.  Critical care was time spent personally by me on the following activities: development of treatment plan with patient and/or surrogate as well as nursing, discussions with consultants, evaluation of patient's response to treatment, examination of patient, obtaining history from patient or surrogate, ordering and performing treatments and interventions, ordering and review of laboratory studies, ordering and review of radiographic studies, pulse oximetry and re-evaluation of patient's condition.   Marland Kitchen1-3 Lead EKG Interpretation  Performed by: Mike Berntsen, Layla Maw, DO Authorized by: Io Dieujuste, Layla Maw, DO     Interpretation: abnormal     ECG rate:  107  ECG rate assessment: tachycardic     Rhythm: sinus tachycardia     Ectopy: none     Conduction: normal       IMPRESSION / MDM / ASSESSMENT AND PLAN / ED COURSE  I reviewed the triage vital signs and the nursing notes.    Patient here with complaints of generalized abdominal pain, vomiting and diarrhea.  History of Crohn's and is immunosuppressed.  Temperature of 99.6 with tachycardia, tachypnea.  The patient is on the cardiac monitor to evaluate for evidence of arrhythmia and/or significant heart rate changes.   DIFFERENTIAL DIAGNOSIS (includes but not limited to):   Bowel obstruction, Crohn's flare, colitis, appendicitis, UTI, bacteremia, sepsis   Patient's presentation is most consistent with acute presentation with potential threat to life or bodily function.   PLAN: Will obtain labs, urine, CT of the abdomen pelvis.  Will give IV antibiotics, IV fluids given patient meets SIRS criteria and is immunosuppressed.  Will give pain and nausea medicine.   MEDICATIONS GIVEN IN ED: Medications  lactated ringers infusion ( Intravenous New Bag/Given 10/09/23 0048)  metroNIDAZOLE (FLAGYL) IVPB 500 mg (500 mg Intravenous New Bag/Given 10/09/23 0233)  0.9 %  sodium chloride infusion (has no administration in time  range)  HYDROmorphone (DILAUDID) injection 1 mg (has no administration in time range)  ondansetron (ZOFRAN) injection 4 mg (4 mg Intravenous Given 10/08/23 2324)  lactated ringers bolus 1,000 mL (0 mLs Intravenous Stopped 10/09/23 0230)  ceFEPIme (MAXIPIME) 2 g in sodium chloride 0.9 % 100 mL IVPB (0 g Intravenous Stopped 10/09/23 0150)  HYDROmorphone (DILAUDID) injection 1 mg (1 mg Intravenous Given 10/09/23 0050)  iohexol (OMNIPAQUE) 350 MG/ML injection 100 mL (100 mLs Intravenous Contrast Given 10/09/23 0146)     ED COURSE: Labs show normal white blood cell count.  Bicarb of 16 with normal anion gap.  Minimally elevated creatinine.  Normal LFTs.  Lactic acid normal.  Urine shows no infection.  CT scan reviewed and interpreted by myself and the radiologist and shows developing small bowel obstruction.  This clinically fits with his picture as well.  Will place NG tube and keep him n.p.o.  Will continue IV hydration.  Patient reports pain improved with Dilaudid but is starting to come back.  Will give a second round of Dilaudid here.  He is comfortable with being admitted to [facility].   Incidentally patient also tested positive for RSV.  He denies any cough, congestion, shortness of breath, chest pain.   CONSULTS:  Consulted and discussed patient's case with hospitalist, Dr. Para March.  I have recommended admission and consulting physician agrees and will place admission orders.  Patient (and family if present) agree with this plan.   I reviewed all nursing notes, vitals, pertinent previous records.  All labs, EKGs, imaging ordered have been independently reviewed and interpreted by myself.    OUTSIDE RECORDS REVIEWED: Reviewed previous records at the [place].       FINAL CLINICAL IMPRESSION(S) / ED DIAGNOSES   Final diagnoses:  Generalized abdominal pain  SBO (small bowel obstruction) (HCC)  RSV (respiratory syncytial virus infection)     Rx / DC Orders   ED Discharge Orders      None        Note:  This document was prepared using Dragon voice recognition software and may include unintentional dictation errors.   Yarethzi Branan, Layla Maw, DO 10/09/23 [phone]

## 2023-10-09 NOTE — Assessment & Plan Note (Signed)
High risk of severe infection due to chronic immunosuppression Cefepime and Flagyl Pain control, antiemetics IV fluids N.p.o. Surgical consult

## 2023-10-09 NOTE — Progress Notes (Signed)
PROGRESS NOTE    Sean Cain  RUE:[id number] DOB: 09-15-1966 DOA: 10/09/2023 PCP: [facility]    Assessment & Plan:   Principal Problem:   SBO (small bowel obstruction) (HCC) Active Problems:   At high risk for infection due to immunosuppression   RSV infection   Crohn's disease (HCC)   Primary hypertension  Assessment and Plan: SBO: r/o as per gen surg.   Abd pain: started on soft diet. Percocet, dilaudid po prn for pain. High risk of severe infection due to chronic immunosuppression. Continue on IVFs.    RSV infection: continue w/ supportive care. Droplet precautions   HTN: continue on home dose of lisinopril    Crohn's disease: holding immunosuppressants due to acute illness   Obesity: BMI 3.3. Would benefit from weight loss      DVT prophylaxis: lovenox  Code Status: full  Family Communication: Disposition Plan: likely d/c back home   Level of care: Med-Surg  Status is: Inpatient Remains inpatient appropriate because: severity of illness    Consultants:  Gen surg   Procedures:   Antimicrobials:   Subjective: Pt c/o abd pain   Objective: Vitals:   10/08/23 2307 10/09/23 0334 10/09/23 0651 10/09/23 0910  BP: (!) 124/93 (!) 152/86 124/75 (!) 144/86  Pulse: (!) 107 82 72 86  Resp: (!) 22 20 20 18   Temp: 99.6 F (37.6 C) 98.9 F (37.2 C)  98.3 F (36.8 C)  TempSrc: Oral Oral  Oral  SpO2: 95% 98% 99% 98%  Weight:      Height:        Intake/Output Summary (Last 24 hours) at 10/09/2023 0938 Last data filed at 10/09/2023 0320 Gross per 24 hour  Intake 455.83 ml  Output --  Net 455.83 ml   Filed Weights   10/08/23 2302  Weight: 114.8 kg    Examination:  General exam: Appears calm and comfortable  Respiratory system: Clear to auscultation. Respiratory effort normal. Cardiovascular system: S1 & S2+. No rubs, gallops or clicks. Gastrointestinal system: Abdomen is obese, soft and nontender. Normal bowel sounds  heard. Central nervous system: Alert and oriented. Moves all extremities  Psychiatry: Judgement and insight appear normal. Mood & affect appropriate.     Data Reviewed: I have personally reviewed following labs and imaging studies  CBC: Recent Labs  Lab 10/08/23 2322 10/09/23 0644  WBC 8.5 5.3  NEUTROABS 6.1  --   HGB 16.8 13.3  HCT 50.9 40.1  MCV 93.9 92.4  PLT 206 168   Basic Metabolic Panel: Recent Labs  Lab 10/08/23 2322 10/09/23 0644  NA 129*  --   K 3.4*  --   CL 105  --   CO2 16*  --   GLUCOSE 113*  --   BUN 13  --   CREATININE 1.35* 1.00  CALCIUM 9.1  --    GFR: Estimated Creatinine Clearance: 106.9 mL/min (by C-G formula based on SCr of 1 mg/dL). Liver Function Tests: Recent Labs  Lab 10/08/23 2322  AST 24  ALT 26  ALKPHOS 79  BILITOT 0.6  PROT 7.6  ALBUMIN 4.0   Recent Labs  Lab 10/08/23 2322  LIPASE 38   No results for input(s): "AMMONIA" in the last 168 hours. Coagulation Profile: No results for input(s): "INR", "PROTIME" in the last 168 hours. Cardiac Enzymes: No results for input(s): "CKTOTAL", "CKMB", "CKMBINDEX", "TROPONINI" in the last 168 hours. BNP (last 3 results) No results for input(s): "PROBNP" in the last 8760 hours. HbA1C: No  results for input(s): "HGBA1C" in the last 72 hours. CBG: No results for input(s): "GLUCAP" in the last 168 hours. Lipid Profile: No results for input(s): "CHOL", "HDL", "LDLCALC", "TRIG", "CHOLHDL", "LDLDIRECT" in the last 72 hours. Thyroid Function Tests: No results for input(s): "TSH", "T4TOTAL", "FREET4", "T3FREE", "THYROIDAB" in the last 72 hours. Anemia Panel: No results for input(s): "VITAMINB12", "FOLATE", "FERRITIN", "TIBC", "IRON", "RETICCTPCT" in the last 72 hours. Sepsis Labs: Recent Labs  Lab 10/08/23 2321 10/09/23 0051  LATICACIDVEN 1.8 1.5    Recent Results (from the past 240 hours)  Blood culture (routine x 2)     Status: None (Preliminary result)   Collection Time: 10/08/23  11:21 PM   Specimen: BLOOD  Result Value Ref Range Status   Specimen Description BLOOD RIGHT ASSIST CONTROL  Final   Special Requests   Final    BOTTLES DRAWN AEROBIC AND ANAEROBIC Blood Culture results may not be optimal due to an inadequate volume of blood received in culture bottles   Culture   Final    NO GROWTH < 12 HOURS Performed at [facility], [address], [place], [place] [address]    Report Status PENDING  Incomplete  Blood culture (routine x 2)     Status: None (Preliminary result)   Collection Time: 10/08/23 11:21 PM   Specimen: BLOOD  Result Value Ref Range Status   Specimen Description BLOOD RIGHT FOREARM  Final   Special Requests   Final    BOTTLES DRAWN AEROBIC AND ANAEROBIC Blood Culture results may not be optimal due to an inadequate volume of blood received in culture bottles   Culture   Final    NO GROWTH < 12 HOURS Performed at [facility], [address], [place], [place] [address]    Report Status PENDING  Incomplete  Resp panel by RT-PCR (RSV, Flu A&B, Covid) Anterior Nasal Swab     Status: Abnormal   Collection Time: 10/09/23 12:51 AM   Specimen: Anterior Nasal Swab  Result Value Ref Range Status   SARS Coronavirus 2 by RT PCR NEGATIVE NEGATIVE Final    Comment: (NOTE) SARS-CoV-2 target nucleic acids are NOT DETECTED.  The SARS-CoV-2 RNA is generally detectable in upper respiratory specimens during the acute phase of infection. The lowest concentration of SARS-CoV-2 viral copies this assay can detect is 138 copies/mL. A negative result does not preclude SARS-Cov-2 infection and should not be used as the sole basis for treatment or other patient management decisions. A negative result may occur with  improper specimen collection/handling, submission of specimen other than nasopharyngeal swab, presence of viral mutation(s) within the areas targeted by this assay, and inadequate number of viral copies(<138 copies/mL). A negative  result must be combined with clinical observations, patient history, and epidemiological information. The expected result is Negative.  Fact Sheet for Patients:  BloggerCourse.com  Fact Sheet for Healthcare Providers:  SeriousBroker.it  This test is no t yet approved or cleared by the [place] FDA and  has been authorized for detection and/or diagnosis of SARS-CoV-2 by FDA under an Emergency Use Authorization (EUA). This EUA will remain  in effect (meaning this test can be used) for the duration of the COVID-19 declaration under Section 564(b)(1) of the Act, 21 U.S.C.section 360bbb-3(b)(1), unless the authorization is terminated  or revoked sooner.       Influenza A by PCR NEGATIVE NEGATIVE Final   Influenza B by PCR NEGATIVE NEGATIVE Final    Comment: (NOTE) The Xpert Xpress SARS-CoV-2/FLU/RSV plus  assay is intended as an aid in the diagnosis of influenza from Nasopharyngeal swab specimens and should not be used as a sole basis for treatment. Nasal washings and aspirates are unacceptable for Xpert Xpress SARS-CoV-2/FLU/RSV testing.  Fact Sheet for Patients: BloggerCourse.com  Fact Sheet for Healthcare Providers: SeriousBroker.it  This test is not yet approved or cleared by the [place] FDA and has been authorized for detection and/or diagnosis of SARS-CoV-2 by FDA under an Emergency Use Authorization (EUA). This EUA will remain in effect (meaning this test can be used) for the duration of the COVID-19 declaration under Section 564(b)(1) of the Act, 21 U.S.C. section 360bbb-3(b)(1), unless the authorization is terminated or revoked.     Resp Syncytial Virus by PCR POSITIVE (A) NEGATIVE Final    Comment: (NOTE) Fact Sheet for Patients: BloggerCourse.com  Fact Sheet for Healthcare  Providers: SeriousBroker.it  This test is not yet approved or cleared by the [place] FDA and has been authorized for detection and/or diagnosis of SARS-CoV-2 by FDA under an Emergency Use Authorization (EUA). This EUA will remain in effect (meaning this test can be used) for the duration of the COVID-19 declaration under Section 564(b)(1) of the Act, 21 U.S.C. section 360bbb-3(b)(1), unless the authorization is terminated or revoked.  Performed at [facility], [address], [place], [place] [address]          Radiology Studies: CT ABDOMEN PELVIS W CONTRAST Result Date: 10/09/2023 CLINICAL DATA:  Abdominal pain with diarrhea and fever EXAM: CT ABDOMEN AND PELVIS WITH CONTRAST TECHNIQUE: Multidetector CT imaging of the abdomen and pelvis was performed using the standard protocol following bolus administration of intravenous contrast. RADIATION DOSE REDUCTION: This exam was performed according to the departmental dose-optimization program which includes automated exposure control, adjustment of the mA and/or kV according to patient size and/or use of iterative reconstruction technique. CONTRAST:  OMNIPAQUE IOHEXOL 350 MG/ML SOLN COMPARISON:  None Available. FINDINGS: Lower chest: No acute abnormality. Hepatobiliary: Hepatic steatosis. Unremarkable gallbladder and biliary tree. Pancreas: Stable. Spleen: Unremarkable. Adrenals/Urinary Tract: Normal adrenal glands. No urinary calculi or hydronephrosis. Unremarkable bladder. Stomach/Bowel: Postoperative change at the gastroesophageal junction. Partial small bowel resection with neoterminal ileum in the right lower quadrant. The small bowel is dilated upstream from the anastomosis. No definite transition point or stricture. Liquid stool throughout the colon. Vascular/Lymphatic: No significant vascular findings are present. No enlarged abdominal or pelvic lymph nodes. Reproductive: Unremarkable. Other:  No free intraperitoneal fluid or air. Musculoskeletal: No acute fracture. IMPRESSION: 1. Partial small bowel resection with ileocolic anastomosis in the right lower quadrant. The small bowel is dilated upstream from the anastomosis without definite transition point or stricture. Findings may represent ileus or partial obstruction. 2. Liquid stool throughout the colon consistent with diarrheal illness. 3. Hepatic steatosis. Electronically Signed   By: Minerva Fester M.D.   On: 10/09/2023 02:12        Scheduled Meds:  enoxaparin (LOVENOX) injection  55 mg Subcutaneous Q24H   Continuous Infusions:  sodium chloride 125 mL/hr at 10/09/23 0328   lactated ringers Stopped (10/09/23 0319)     LOS: 0 days      Charise Killian, MD [facility] Pager [phone]  If 7PM-7AM, please contact night-coverage [email or web address] 10/09/2023, 9:38 AM

## 2023-10-09 NOTE — ED Notes (Signed)
Paused NS for transport from ER to 201, unable to log this in the [facility] due to locked work station.

## 2023-10-09 NOTE — Assessment & Plan Note (Signed)
Uncomplicated Holding immunosuppressants due to acute illness

## 2023-10-09 NOTE — ED Notes (Signed)
Pt ambulated to the bathroom with steady gait, back into bed.

## 2023-10-09 NOTE — Progress Notes (Signed)
PHARMACIST - PHYSICIAN COMMUNICATION  CONCERNING:  Enoxaparin (Lovenox) for DVT Prophylaxis    RECOMMENDATION: Patient was prescribed enoxaprin 40mg  q24 hours for VTE prophylaxis.   Filed Weights   10/08/23 2302  Weight: 114.8 kg (253 lb)    Body mass index is 33.38 kg/m.  Estimated Creatinine Clearance: 79.2 mL/min (A) (by C-G formula based on SCr of 1.35 mg/dL (H)).   Based on [facility] policy patient is candidate for enoxaparin 0.5mg /kg TBW SQ every 24 hours based on BMI being >30.  DESCRIPTION: Pharmacy has adjusted enoxaparin dose per [facility] policy.  Patient is now receiving enoxaparin 0.5 mg/kg every 24 hours   Otelia Sergeant, PharmD, [facility] 10/09/2023 6:38 AM

## 2023-10-09 NOTE — Consult Note (Signed)
Patient ID: Sean Cain, male   DOB: 1966-01-26, 58 y.o.   MRN: [id number]  HPI Sean Cain is a 58 y.o. male Patient with history of Crohn's disease on Skyrizi (every 6 weeks,), azathioprine, and prednisone.He Does have a history of Ileocecetomy for Chron;'s disease in 2011. HE also had Lap Nissen fundoplication complicated by slipped Nissen and apparently had [place] Procedure. [facility]. Presented to the emergency room with severe abdominal pain intermittent, beer colicky type diffuse associated with nausea and diarrhea.  Has been present for the last couple of days but has worsened over the last day or so.  No specific alleviating or aggravating factors Evidence of emesis   Work up in the ED revealed a normal WBC at 8.5K, Hgb to 16.8, sCr - 1.35 (slightly above baseline), hypokalemia to 3.4, hyponatremia to 129, venous lactate normal x3. Interesting tested positive for RSV. CT Abdomen/Pelvis was obtained personally reviewed and concerning for diffuse dilation in the small bowel without obvious transition, ileocolic anastomosis present and patent with fluid filled colon. He was admitted to medicine service. Started on Cipro/Flagyl. He Has no free air nor bowel ischemia   Surgery is consulted by hospitalist physician Dr. Lindajo Royal, MD in this context for evaluation and management of possible ileus vs SBO in setting of Crohn's.  HPI  Past Medical History:  Diagnosis Date   Asthma    Depression    Hyperlipidemia    Hypertension    Shoulder mass 2017    Past Surgical History:  Procedure Laterality Date   KNEE CARTILAGE SURGERY  2002, 2005   LAPAROSCOPIC SMALL BOWEL RESECTION  2011   LIPOMA EXCISION Left 07/05/2016   Procedure: EXCISION LIPOMA;  Surgeon: Lattie Haw, MD;  Location: [facility] ORS;  Service: General;  Laterality: Left;   NASAL SINUS SURGERY  2006   nissenfundoplication  2000    Family History  Problem Relation Age of Onset    Alzheimer's disease Mother    Heart disease Father    Asthma Father    Allergic rhinitis Neg Hx    Angioedema Neg Hx    Eczema Neg Hx    Immunodeficiency Neg Hx    Urticaria Neg Hx     Social History Social History   Tobacco Use   Smoking status: Former    Current packs/day: 0.00    Average packs/day: 1 pack/day for 10.0 years (10.0 ttl pk-yrs)    Types: Cigarettes    Start date: 04/19/1988    Quit date: 04/19/1998    Years since quitting: 25.4   Smokeless tobacco: Former  Substance Use Topics   Alcohol use: No   Drug use: No    Allergies  Allergen Reactions   Fluticasone Propionate Hives   Other Anaphylaxis and Shortness Of Breath    Dust mite, mold, horses, mice, cats, roaches, grass   Remicade [Infliximab] Anaphylaxis   Singulair [Montelukast Sodium] Other (See Comments)    periphial  swelling   Flonase [Fluticasone Propionate] Hives   Humira [Adalimumab] Hives   Montelukast Other (See Comments)    Other Reaction: Other reaction    Current Facility-Administered Medications  Medication Dose Route Frequency Provider Last Rate Last Admin   0.9 %  sodium chloride infusion   Intravenous Continuous Charise Killian, MD 75 mL/hr at 10/09/23 1058 Rate Change at 10/09/23 1058   acetaminophen (TYLENOL) tablet 650 mg  650 mg Oral Q6H PRN Andris Baumann, MD  Or   acetaminophen (TYLENOL) suppository 650 mg  650 mg Rectal Q6H PRN Andris Baumann, MD       enoxaparin (LOVENOX) injection 55 mg  55 mg Subcutaneous Q24H Lindajo Royal V, MD   55 mg at 10/09/23 0919   HYDROmorphone (DILAUDID) injection 0.5-1 mg  0.5-1 mg Intravenous Q2H PRN Andris Baumann, MD   1 mg at 10/09/23 7829   lactated ringers infusion   Intravenous Continuous Ward, Layla Maw, DO   Stopped at 10/09/23 0319   ondansetron (ZOFRAN) tablet 4 mg  4 mg Oral Q6H PRN Andris Baumann, MD       Or   ondansetron [facility]) injection 4 mg  4 mg Intravenous Q6H PRN Andris Baumann, MD       Current Outpatient  Medications  Medication Sig Dispense Refill   acetaminophen (TYLENOL) 325 MG tablet Take 325 mg by mouth 5 (five) times daily.     ANTI-ITCH lotion Apply 1 application topically 2 (two) times daily.     atorvastatin (LIPITOR) 40 MG tablet Take 20 mg by mouth at bedtime.     calcipotriene (DOVONOX) 0.005 % cream Apply 1 application topically every morning.     Calcium Carb-Cholecalciferol (CALCIUM CARBONATE-VITAMIN D3) 600-400 MG-UNIT TABS Take 1 tablet by mouth daily.     cloNIDine (CATAPRES) 0.1 MG tablet Take 0.1 mg by mouth 2 (two) times daily.     cyanocobalamin (,VITAMIN B-12,) 1000 MCG/ML injection Inject 1,000 mcg into the muscle every 30 (thirty) days.     cyclobenzaprine (FLEXERIL) 10 MG tablet Take 10 mg by mouth at bedtime.     diclofenac Sodium (VOLTAREN) 1 % GEL Apply 4 g topically in the morning, at noon, in the evening, and at bedtime.     DULoxetine (CYMBALTA) 30 MG capsule Take 30 mg by mouth daily.     EPINEPHrine 0.3 mg/0.3 mL IJ SOAJ injection Inject 0.3 mg into the muscle as needed for anaphylaxis.     fluorouracil (EFUDEX) 5 % cream Apply 1 application topically 2 (two) times daily.     folic acid (FOLVITE) 1 MG tablet Take 1 mg by mouth daily.     Glucagon, rDNA, (GLUCAGON EMERGENCY) 1 MG KIT Inject 1 mg into the muscle once.     hydrochlorothiazide (HYDRODIURIL) 25 MG tablet Take 12.5 mg by mouth every morning.     Ipratropium-Albuterol (COMBIVENT RESPIMAT) 20-100 MCG/ACT AERS respimat Inhale 1 puff into the lungs every 6 (six) hours.     lisinopril (PRINIVIL,ZESTRIL) 20 MG tablet Take 10 mg by mouth daily.     loperamide (IMODIUM) 2 MG capsule Take 2 mg by mouth as needed for diarrhea or loose stools.     methotrexate 50 MG/2ML injection Inject 25 mg into the skin once a week.     omeprazole (PRILOSEC) 40 MG capsule Take 40 mg by mouth daily.     predniSONE (DELTASONE) 10 MG tablet Take 25 mg by mouth daily with breakfast.     pregabalin (LYRICA) 100 MG capsule Take  100-200 mg by mouth 2 (two) times daily. 200 mg every morning, 100 mg at bedtime     STELARA 90 MG/ML SOSY injection Inject 1 mL into the skin every 28 (twenty-eight) days.     traMADol (ULTRAM) 50 MG tablet Take 50 mg by mouth 2 (two) times daily as needed.     traZODone (DESYREL) 100 MG tablet Take 200 mg by mouth at bedtime.     Vitamin D, Ergocalciferol, (  DRISDOL) 1.25 MG (50000 UNIT) CAPS capsule Take 50,000 Units by mouth every 7 (seven) days. Monday       Review of Systems Full ROS  was asked and was negative except for the information on the HPI  Physical Exam Blood pressure (!) 144/86, pulse 86, temperature 98.3 F (36.8 C), temperature source Oral, resp. rate 18, height 6\' 1"  (1.854 m), weight 114.8 kg, SpO2 98%. CONSTITUTIONAL: NAD. EYES: Pupils are equal, round,, Sclera are non-icteric. EARS, NOSE, MOUTH AND THROAT: The oropharynx is clear. The oral mucosa is pink and moist. Hearing is intact to voice. LYMPH NODES:  Lymph nodes in the neck are normal. RESPIRATORY:  Lungs are clear. There is normal respiratory effort, with equal breath sounds bilaterally, and without pathologic use of accessory muscles. CARDIOVASCULAR: Heart is regular without murmurs, gallops, or rubs. GI: Generous midline laparotomy scar ,the abdomen is  soft, nontender at this time.  No peritonitis and nondistended. There are no palpable masses. There is no hepatosplenomegaly. There are normal bowel sounds in all quadrants. GU: Rectal deferred.   MUSCULOSKELETAL: Normal muscle strength and tone. No cyanosis or edema.   SKIN: Turgor is good and there are no pathologic skin lesions or ulcers. NEUROLOGIC: Motor and sensation is grossly normal. Cranial nerves are grossly intact. PSYCH:  Oriented to person, place and time. Affect is normal.  Data Reviewed  I have personally reviewed the patient's imaging, laboratory findings and medical records.    Assessment/Plan 58 year old male with history of chron's  disease presents with intermittent abdominal pain.  There is no evidence of obstruction or bowel perforation.  This is likely a Crohn's flareup versus a functional GI issues.  No evidence of perforation bleeding or obstruction at this time.  No need for any surgical intervention.  Need to involve GI for further evaluation of his Crohn's disease.  Will with IV fluids n.p.o., We will be available Also follow-up with GI and general surgery at the [facility] where he typically is his care.   Sterling Big, MD FACS General Surgeon 10/09/2023, 11:12 AM

## 2023-10-09 NOTE — ED Notes (Signed)
Pt requesting pain medication at this time. No PRN medication ordered at this time. Notified Dr. Para March, MD

## 2023-10-09 NOTE — ED Notes (Signed)
Pt updated on why he is here, and pharmacy contacted to talk with patient about his home medications. PT made aware.

## 2023-10-09 NOTE — Progress Notes (Signed)
CODE SEPSIS - PHARMACY COMMUNICATION  **Broad Spectrum Antibiotics should be administered within 1 hour of Sepsis diagnosis**  Time Code Sepsis Called/Page Received: 9147  Antibiotics Ordered: Cefepime & Flagyl  Time of 1st antibiotic administration: 0049  Otelia Sergeant, PharmD, [facility] 10/09/2023 12:48 AM

## 2023-10-09 NOTE — ED Notes (Signed)
Dr. Para March, Admitting MD at bedside at this time.

## 2023-10-09 NOTE — Assessment & Plan Note (Signed)
Patient asymptomatic Droplet precaution

## 2023-10-09 NOTE — ED Notes (Signed)
Pt requesting PRN anxiety medication at this time

## 2023-10-09 NOTE — Progress Notes (Signed)
Elink monitoring for the code sepsis protocol.  

## 2023-10-09 NOTE — H&P (Signed)
History and Physical    Patient: Sean Cain:[id number] DOB: [birth year] DOA: 10/09/2023 DOS: the patient was seen and examined on 10/09/2023 PCP: [facility]  Patient coming from: Home  Chief Complaint:  Chief Complaint  Patient presents with   Abdominal Pain    HPI: Sean Cain is a 58 y.o. male with medical history significant for Crohn's disease (on prednisone, azathioprine and Skyrizi), asthma, HTN, HLD, GERD and prior history of SBO with laparoscopic small bowel resection, and prior Nissen fundoplication who presents to the ED with a several day history of generalized abdominal pain and bloating associated with nausea . No vomiting.  He denies diarrhea, black or bloody stools, passing little gas.  Denies dysuria, fever or chills. ED course and data review: Tachycardic to 107 and tachypneic to 22, low-grade temp of 99.6, BP 124/93. Labs notable for normal CBCCreatinine 1.35 above baseline of 1.03 with bicarb 16.  Sodium 129 and potassium 3.4. Lipase and LFTs WNL RSV positive Urinalysis unremarkable CT abdomen and pelvis consistent with ileus or partial obstruction as follows: IMPRESSION: 1. Partial small bowel resection with ileocolic anastomosis in the right lower quadrant. The small bowel is dilated upstream from the anastomosis without definite transition point or stricture. Findings may represent ileus or partial obstruction. 2. Liquid stool throughout the colon consistent with diarrheal illness. 3. Hepatic steatosis.  Patient treated with an LR bolus, hydromorphone and Zofran started on cefepime and metronidazole and hospitalist consulted for admission   Review of Systems: As mentioned in the history of present illness. All other systems reviewed and are negative.  Past Medical History:  Diagnosis Date   Asthma    Depression    Hyperlipidemia    Hypertension    Shoulder mass 2017   Past Surgical History:  Procedure Laterality Date    KNEE CARTILAGE SURGERY  2002, 2005   LAPAROSCOPIC SMALL BOWEL RESECTION  2011   LIPOMA EXCISION Left 07/05/2016   Procedure: EXCISION LIPOMA;  Surgeon: Lattie Haw, MD;  Location: [facility] ORS;  Service: General;  Laterality: Left;   NASAL SINUS SURGERY  2006   nissenfundoplication  2000   Social History:  reports that he quit smoking about 25 years ago. He started smoking about 35 years ago. He has a 10 pack-year smoking history. He has quit using smokeless tobacco. He reports that he does not drink alcohol and does not use drugs.  Allergies  Allergen Reactions   Fluticasone Propionate Hives   Other Anaphylaxis and Shortness Of Breath    Dust mite, mold, horses, mice, cats, roaches, grass   Remicade [Infliximab] Anaphylaxis   Singulair [Montelukast Sodium] Other (See Comments)    periphial  swelling   Flonase [Fluticasone Propionate] Hives   Humira [Adalimumab] Hives   Montelukast Other (See Comments)    Other Reaction: Other reaction    Family History  Problem Relation Age of Onset   Alzheimer's disease Mother    Heart disease Father    Asthma Father    Allergic rhinitis Neg Hx    Angioedema Neg Hx    Eczema Neg Hx    Immunodeficiency Neg Hx    Urticaria Neg Hx     Prior to Admission medications   Medication Sig Start Date End Date Taking? Authorizing Provider  acetaminophen (TYLENOL) 325 MG tablet Take 325 mg by mouth 5 (five) times daily. 08/09/20   [provider]  ANTI-ITCH lotion Apply 1 application topically 2 (two) times daily. 04/11/20   [provider]  atorvastatin (LIPITOR) 40 MG tablet Take 20 mg by mouth at bedtime.    [provider]  calcipotriene (DOVONOX) 0.005 % cream Apply 1 application topically every morning. 08/29/20   [provider]  Calcium Carb-Cholecalciferol (CALCIUM CARBONATE-VITAMIN D3) 600-400 MG-UNIT TABS Take 1 tablet by mouth daily.    [provider]  cloNIDine (CATAPRES) 0.1 MG tablet Take  0.1 mg by mouth 2 (two) times daily. 04/22/20   [provider]  cyanocobalamin (,VITAMIN B-12,) 1000 MCG/ML injection Inject 1,000 mcg into the muscle every 30 (thirty) days. 07/18/20   [provider]  cyclobenzaprine (FLEXERIL) 10 MG tablet Take 10 mg by mouth at bedtime.    [provider]  diclofenac Sodium (VOLTAREN) 1 % GEL Apply 4 g topically in the morning, at noon, in the evening, and at bedtime. 05/21/20   [provider]  DULoxetine (CYMBALTA) 30 MG capsule Take 30 mg by mouth daily. 06/23/20   [provider]  EPINEPHrine 0.3 mg/0.3 mL IJ SOAJ injection Inject 0.3 mg into the muscle as needed for anaphylaxis.    [provider]  fluorouracil (EFUDEX) 5 % cream Apply 1 application topically 2 (two) times daily. 08/29/20   [provider]  folic acid (FOLVITE) 1 MG tablet Take 1 mg by mouth daily.    [provider]  Glucagon, rDNA, (GLUCAGON EMERGENCY) 1 MG KIT Inject 1 mg into the muscle once. 06/21/20   [provider]  hydrochlorothiazide (HYDRODIURIL) 25 MG tablet Take 12.5 mg by mouth every morning. 06/21/20   [provider]  Ipratropium-Albuterol (COMBIVENT RESPIMAT) 20-100 MCG/ACT AERS respimat Inhale 1 puff into the lungs every 6 (six) hours.    [provider]  lisinopril (PRINIVIL,ZESTRIL) 20 MG tablet Take 10 mg by mouth daily.    [provider]  loperamide (IMODIUM) 2 MG capsule Take 2 mg by mouth as needed for diarrhea or loose stools.    [provider]  methotrexate 50 MG/2ML injection Inject 25 mg into the skin once a week. 05/21/20   [provider]  omeprazole (PRILOSEC) 40 MG capsule Take 40 mg by mouth daily. 07/18/20   [provider]  predniSONE (DELTASONE) 10 MG tablet Take 25 mg by mouth daily with breakfast.    [provider]  pregabalin (LYRICA) 100 MG capsule Take 100-200 mg by mouth 2 (two) times daily. 200 mg every morning,  100 mg at bedtime    [provider]  STELARA 90 MG/ML SOSY injection Inject 1 mL into the skin every 28 (twenty-eight) days. 06/21/20   [provider]  traMADol (ULTRAM) 50 MG tablet Take 50 mg by mouth 2 (two) times daily as needed. 08/23/20   [provider]  traZODone (DESYREL) 100 MG tablet Take 200 mg by mouth at bedtime. 06/21/20   [provider]  Vitamin D, Ergocalciferol, (DRISDOL) 1.25 MG (50000 UNIT) CAPS capsule Take 50,000 Units by mouth every 7 (seven) days. Monday    [provider]    Physical Exam: Vitals:   10/08/23 2302 10/08/23 2307 10/09/23 0334  BP:  (!) 124/93 (!) 152/86  Pulse:  (!) 107 82  Resp:  (!) 22 20  Temp:  99.6 F (37.6 C) 98.9 F (37.2 C)  TempSrc:  Oral Oral  SpO2:  95% 98%  Weight: 114.8 kg    Height: 6\' 1"  (1.854 m)     Physical Exam Vitals and nursing note reviewed.  Constitutional:  General: He is not in acute distress. HENT:     Head: Normocephalic and atraumatic.  Cardiovascular:     Rate and Rhythm: Regular rhythm. Tachycardia present.     Heart sounds: Normal heart sounds.  Pulmonary:     Effort: Pulmonary effort is normal.     Breath sounds: Normal breath sounds.  Abdominal:     General: There is distension. There is no abdominal bruit.     Palpations: Abdomen is soft.     Tenderness: There is abdominal tenderness.  Neurological:     Mental Status: Mental status is at baseline.     Labs on Admission: I have personally reviewed following labs and imaging studies  CBC: Recent Labs  Lab 10/08/23 2322  WBC 8.5  NEUTROABS 6.1  HGB 16.8  HCT 50.9  MCV 93.9  PLT 206   Basic Metabolic Panel: Recent Labs  Lab 10/08/23 2322  NA 129*  K 3.4*  CL 105  CO2 16*  GLUCOSE 113*  BUN 13  CREATININE 1.35*  CALCIUM 9.1   GFR: Estimated Creatinine Clearance: 79.2 mL/min (A) (by C-G formula based on SCr of 1.35 mg/dL (H)). Liver Function Tests: Recent Labs  Lab 10/08/23 2322   AST 24  ALT 26  ALKPHOS 79  BILITOT 0.6  PROT 7.6  ALBUMIN 4.0   Recent Labs  Lab 10/08/23 2322  LIPASE 38   No results for input(s): "AMMONIA" in the last 168 hours. Coagulation Profile: No results for input(s): "INR", "PROTIME" in the last 168 hours. Cardiac Enzymes: No results for input(s): "CKTOTAL", "CKMB", "CKMBINDEX", "TROPONINI" in the last 168 hours. BNP (last 3 results) No results for input(s): "PROBNP" in the last 8760 hours. HbA1C: No results for input(s): "HGBA1C" in the last 72 hours. CBG: No results for input(s): "GLUCAP" in the last 168 hours. Lipid Profile: No results for input(s): "CHOL", "HDL", "LDLCALC", "TRIG", "CHOLHDL", "LDLDIRECT" in the last 72 hours. Thyroid Function Tests: No results for input(s): "TSH", "T4TOTAL", "FREET4", "T3FREE", "THYROIDAB" in the last 72 hours. Anemia Panel: No results for input(s): "VITAMINB12", "FOLATE", "FERRITIN", "TIBC", "IRON", "RETICCTPCT" in the last 72 hours. Urine analysis:    Component Value Date/Time   COLORURINE YELLOW (A) 10/09/2023 0051   APPEARANCEUR HAZY (A) 10/09/2023 0051   LABSPEC 1.027 10/09/2023 0051   PHURINE 5.0 10/09/2023 0051   GLUCOSEU NEGATIVE 10/09/2023 0051   HGBUR MODERATE (A) 10/09/2023 0051   BILIRUBINUR NEGATIVE 10/09/2023 0051   KETONESUR NEGATIVE 10/09/2023 0051   PROTEINUR 100 (A) 10/09/2023 0051   NITRITE NEGATIVE 10/09/2023 0051   LEUKOCYTESUR NEGATIVE 10/09/2023 0051    Radiological Exams on Admission: CT ABDOMEN PELVIS W CONTRAST Result Date: 10/09/2023 CLINICAL DATA:  Abdominal pain with diarrhea and fever EXAM: CT ABDOMEN AND PELVIS WITH CONTRAST TECHNIQUE: Multidetector CT imaging of the abdomen and pelvis was performed using the standard protocol following bolus administration of intravenous contrast. RADIATION DOSE REDUCTION: This exam was performed according to the departmental dose-optimization program which includes automated exposure control, adjustment of the mA  and/or kV according to patient size and/or use of iterative reconstruction technique. CONTRAST:  OMNIPAQUE IOHEXOL 350 MG/ML SOLN COMPARISON:  None Available. FINDINGS: Lower chest: No acute abnormality. Hepatobiliary: Hepatic steatosis. Unremarkable gallbladder and biliary tree. Pancreas: Stable. Spleen: Unremarkable. Adrenals/Urinary Tract: Normal adrenal glands. No urinary calculi or hydronephrosis. Unremarkable bladder. Stomach/Bowel: Postoperative change at the gastroesophageal junction. Partial small bowel resection with neoterminal ileum in the right lower quadrant. The small bowel is dilated upstream from the anastomosis.  No definite transition point or stricture. Liquid stool throughout the colon. Vascular/Lymphatic: No significant vascular findings are present. No enlarged abdominal or pelvic lymph nodes. Reproductive: Unremarkable. Other: No free intraperitoneal fluid or air. Musculoskeletal: No acute fracture. IMPRESSION: 1. Partial small bowel resection with ileocolic anastomosis in the right lower quadrant. The small bowel is dilated upstream from the anastomosis without definite transition point or stricture. Findings may represent ileus or partial obstruction. 2. Liquid stool throughout the colon consistent with diarrheal illness. 3. Hepatic steatosis. Electronically Signed   By: Minerva Fester M.D.   On: 10/09/2023 02:12     Data Reviewed: Relevant notes from primary care and specialist visits, past discharge summaries as available in EHR, including Care Everywhere. Prior diagnostic testing as pertinent to current admission diagnoses Updated medications and problem lists for reconciliation ED course, including vitals, labs, imaging, treatment and response to treatment Triage notes, nursing and pharmacy notes and ED provider's notes Notable results as noted in HPI   Assessment and Plan: * SBO (small bowel obstruction) (HCC) High risk of severe infection due to chronic  immunosuppression Cefepime and Flagyl Pain control, antiemetics IV fluids N.p.o. Surgical consult  RSV infection Patient asymptomatic Droplet precaution  Primary hypertension Continue lisinopril  Crohn's disease (HCC) Uncomplicated Holding immunosuppressants due to acute illness     DVT prophylaxis: Lovenox  Consults: surgery dr pabon  Advance Care Planning:   Code Status: Prior   Family Communication: none  Disposition Plan: Back to previous home environment  Severity of Illness: The appropriate patient status for this patient is INPATIENT. Inpatient status is judged to be reasonable and necessary in order to provide the required intensity of service to ensure the patient's safety. The patient's presenting symptoms, physical exam findings, and initial radiographic and laboratory data in the context of their chronic comorbidities is felt to place them at high risk for further clinical deterioration. Furthermore, it is not anticipated that the patient will be medically stable for discharge from the hospital within 2 midnights of admission.   * I certify that at the point of admission it is my clinical judgment that the patient will require inpatient hospital care spanning beyond 2 midnights from the point of admission due to high intensity of service, high risk for further deterioration and high frequency of surveillance required.*  Author: Andris Baumann, MD 10/09/2023 3:52 AM  For on call review [email or web address].

## 2023-10-09 NOTE — Assessment & Plan Note (Signed)
Continue lisinopril

## 2023-10-10 DIAGNOSIS — B338 Other specified viral diseases: Secondary | ICD-10-CM

## 2023-10-10 LAB — CBC
HCT: 38.5 % — ABNORMAL LOW (ref 39.0–52.0)
Hemoglobin: 12.9 g/dL — ABNORMAL LOW (ref 13.0–17.0)
MCH: 30.6 pg (ref 26.0–34.0)
MCHC: 33.5 g/dL (ref 30.0–36.0)
MCV: 91.4 fL (ref 80.0–100.0)
Platelets: 172 10*3/uL (ref 150–400)
RBC: 4.21 MIL/uL — ABNORMAL LOW (ref 4.22–5.81)
RDW: 13.9 % (ref 11.5–15.5)
WBC: 3.8 10*3/uL — ABNORMAL LOW (ref 4.0–10.5)
nRBC: 0 % (ref 0.0–0.2)

## 2023-10-10 LAB — BASIC METABOLIC PANEL
Anion gap: 9 (ref 5–15)
BUN: 13 mg/dL (ref 6–20)
CO2: 23 mmol/L (ref 22–32)
Calcium: 8.5 mg/dL — ABNORMAL LOW (ref 8.9–10.3)
Chloride: 107 mmol/L (ref 98–111)
Creatinine, Ser: 1.07 mg/dL (ref 0.61–1.24)
GFR, Estimated: >60 mL/min (ref >60)
Glucose, Bld: 87 mg/dL (ref 70–99)
Potassium: 3.5 mmol/L (ref 3.5–5.1)
Sodium: 139 mmol/L (ref 135–145)

## 2023-10-10 MED ORDER — OXYCODONE-ACETAMINOPHEN 5-325 MG PO TABS: 1.0000 | ORAL_TABLET | Freq: Four times a day (QID) | ORAL | 0 refills | Status: AC | PRN

## 2023-10-10 MED ORDER — BENZONATATE 100 MG PO CAPS: 200.0000 mg | ORAL_CAPSULE | Freq: Three times a day (TID) | ORAL | 0 refills | Status: AC | PRN

## 2023-10-10 MED ORDER — ONDANSETRON 4 MG PO TBDP: 4.0000 mg | ORAL_TABLET | Freq: Three times a day (TID) | ORAL | 0 refills | Status: AC | PRN

## 2023-10-10 NOTE — TOC CM/SW Note (Signed)
Transition of Care [facility]) - Inpatient Brief Assessment   Patient Details  Name: Sean Cain MRN: [id number] Date of Birth: 07-21-1966  Transition of Care [facility] CM/SW Contact:    Margarito Liner, LCSW Phone Number: 10/10/2023, 1:27 PM   Clinical Narrative: Patient has orders to discharge home today. Chart reviewed. No TOC needs identified. CSW signing off.  Transition of Care Asessment: Insurance and Status: Insurance coverage has been reviewed Patient has primary care physician: Yes Home environment has been reviewed: Single family home Prior level of function:: Not documented Prior/Current Home Services: No current home services Social Drivers of Health Review: SDOH reviewed no interventions necessary Readmission risk has been reviewed: Yes Transition of care needs: no transition of care needs at this time

## 2023-10-10 NOTE — Discharge Summary (Signed)
Physician Discharge Summary  Sean Cain WUJ:[id number] DOB: 20-Apr-1966 DOA: 10/09/2023  PCP: [facility]  Admit date: 10/09/2023 Discharge date: 10/10/2023  Admitted From: home  Disposition:  home   Recommendations for Outpatient Follow-up:  Follow up with PCP in 1-2 weeks   Home Health: no  Equipment/Devices:  Discharge Condition: stable  CODE STATUS: full  Diet recommendation: Heart Healthy   Brief/Interim Summary: HPI was taken from Dr. Para March: Sean Cain is a 58 y.o. male with medical history significant for Crohn's disease (on prednisone, azathioprine and Skyrizi), asthma, HTN, HLD, GERD and prior history of SBO with laparoscopic small bowel resection, and prior Nissen fundoplication who presents to the ED with a several day history of generalized abdominal pain and bloating associated with nausea . No vomiting.  He denies diarrhea, black or bloody stools, passing little gas.  Denies dysuria, fever or chills. ED course and data review: Tachycardic to 107 and tachypneic to 22, low-grade temp of 99.6, BP 124/93. Labs notable for normal CBCCreatinine 1.35 above baseline of 1.03 with bicarb 16.  Sodium 129 and potassium 3.4. Lipase and LFTs WNL RSV positive Urinalysis unremarkable CT abdomen and pelvis consistent with ileus or partial obstruction as follows: IMPRESSION: 1. Partial small bowel resection with ileocolic anastomosis in the right lower quadrant. The small bowel is dilated upstream from the anastomosis without definite transition point or stricture. Findings may represent ileus or partial obstruction. 2. Liquid stool throughout the colon consistent with diarrheal illness. 3. Hepatic steatosis.   Patient treated with an LR bolus, hydromorphone and Zofran started on cefepime and metronidazole and hospitalist consulted for admission   Discharge Diagnoses:  Principal Problem:   SBO (small bowel obstruction) (HCC) Active Problems:   At  high risk for infection due to immunosuppression   RSV infection   Crohn's disease (HCC)   Primary hypertension   Generalized abdominal pain  SBO: r/o as per gen surg.   Abd pain: advanced to a regular diet today. Percocet, dilaudid po prn for pain. High risk of severe infection due to chronic immunosuppression. D/c IVFs. Pain improved    RSV infection: continue w/ supportive care. Droplet precautions   HTN: continue on home dose of lisinopril    Crohn's disease: can restart home medications for Crohn's at d/c   Obesity: BMI 3.3. Would benefit from weight loss   Discharge Instructions  Discharge Instructions     Diet - low sodium heart healthy   Complete by: As directed    Discharge instructions   Complete by: As directed    F/u w/ PCP in 1-2 weeks.   Increase activity slowly   Complete by: As directed       Allergies as of 10/10/2023       Reactions   Fluticasone Propionate Hives   Other Anaphylaxis, Shortness Of Breath   Dust mite, mold, horses, mice, cats, roaches, grass   Remicade [infliximab] Anaphylaxis   Singulair [montelukast Sodium] Other (See Comments)   periphial  swelling   Flonase [fluticasone Propionate] Hives   Humira [adalimumab] Hives   Montelukast Other (See Comments)   Other Reaction: Other reaction        Medication List     STOP taking these medications    cyanocobalamin 1000 MCG/ML injection Commonly known as: VITAMIN B12   cyclobenzaprine 10 MG tablet Commonly known as: FLEXERIL   methotrexate 50 MG/2ML injection   Stelara 90 MG/ML Sosy injection Generic drug: ustekinumab   traMADol 50 MG tablet  Commonly known as: ULTRAM       TAKE these medications    acetaminophen 325 MG tablet Commonly known as: TYLENOL Take 325 mg by mouth every 6 (six) hours as needed for mild pain (pain score 1-3).   Anti-Itch lotion Generic drug: camphor-menthol Apply 1 application  topically 2 (two) times daily.   atorvastatin 40 MG  tablet Commonly known as: LIPITOR Take 20 mg by mouth at bedtime.   azaTHIOprine 50 MG tablet Commonly known as: IMURAN Take 50 mg by mouth daily.   benzonatate 100 MG capsule Commonly known as: Tessalon Perles Take 2 capsules (200 mg total) by mouth 3 (three) times daily as needed for up to 14 days for cough.   calcipotriene 0.005 % cream Commonly known as: DOVONOX Apply 1 application topically every morning.   Calcium Carbonate-Vitamin D3 600-400 MG-UNIT Tabs Take 1 tablet by mouth daily.   cetirizine 10 MG tablet Commonly known as: ZYRTEC Take 1 tablet by mouth daily.   cloNIDine 0.1 MG tablet Commonly known as: CATAPRES Take 0.1 mg by mouth 2 (two) times daily.   Combivent Respimat 20-100 MCG/ACT Aers respimat Generic drug: Ipratropium-Albuterol Inhale 1 puff into the lungs every 6 (six) hours.   diclofenac Sodium 1 % Gel Commonly known as: VOLTAREN Apply 4 g topically in the morning, at noon, in the evening, and at bedtime.   doxycycline 100 MG capsule Commonly known as: MONODOX Take 100 mg by mouth 2 (two) times daily.   DULoxetine 30 MG capsule Commonly known as: CYMBALTA Take 30 mg by mouth daily.   EPINEPHrine 0.3 mg/0.3 mL Soaj injection Commonly known as: EPI-PEN Inject 0.3 mg into the muscle as needed for anaphylaxis.   Ferrous Fumarate 324 (106 Fe) MG Tabs tablet Commonly known as: HEMOCYTE - 106 mg FE Take 1 tablet by mouth 3 (three) times a week. Monday, Wednesday and Friday night   fluorouracil 5 % cream Commonly known as: EFUDEX Apply 1 application topically 2 (two) times daily.   folic acid 1 MG tablet Commonly known as: FOLVITE Take 1 mg by mouth daily.   Glucagon Emergency 1 MG Kit Inject 1 mg into the muscle once.   hydrochlorothiazide 25 MG tablet Commonly known as: HYDRODIURIL Take 12.5 mg by mouth every morning.   lisinopril 20 MG tablet Commonly known as: ZESTRIL Take 10 mg by mouth daily.   loperamide 2 MG  capsule Commonly known as: IMODIUM Take 2 mg by mouth as needed for diarrhea or loose stools.   omeprazole 40 MG capsule Commonly known as: PRILOSEC Take 40 mg by mouth daily.   oxyCODONE-acetaminophen 5-325 MG tablet Commonly known as: PERCOCET/ROXICET Take 1 tablet by mouth every 6 (six) hours as needed for up to 3 days for moderate pain (pain score 4-6).   pioglitazone 30 MG tablet Commonly known as: ACTOS Take 30 mg by mouth daily.   predniSONE 10 MG tablet Commonly known as: DELTASONE Take 10 mg by mouth daily with breakfast.   pregabalin 100 MG capsule Commonly known as: LYRICA Take 100-200 mg by mouth 2 (two) times daily. 200 mg every morning, 100 mg at bedtime   Risankizumab-rzaa 360 MG/2.4ML Soct Inject 1 kit into the skin every 8 (eight) weeks.   tamsulosin 0.4 MG Caps capsule Commonly known as: FLOMAX Take 0.4 mg by mouth daily.   traZODone 100 MG tablet Commonly known as: DESYREL Take 300 mg by mouth at bedtime.   Vitamin D (Ergocalciferol) 1.25 MG (50000 UNIT) Caps capsule  Commonly known as: DRISDOL Take 50,000 Units by mouth every 7 (seven) days. Monday        Allergies  Allergen Reactions   Fluticasone Propionate Hives   Other Anaphylaxis and Shortness Of Breath    Dust mite, mold, horses, mice, cats, roaches, grass   Remicade [Infliximab] Anaphylaxis   Singulair [Montelukast Sodium] Other (See Comments)    periphial  swelling   Flonase [Fluticasone Propionate] Hives   Humira [Adalimumab] Hives   Montelukast Other (See Comments)    Other Reaction: Other reaction    Consultations: Gen surg    Procedures/Studies: CT ABDOMEN PELVIS W CONTRAST Result Date: 10/09/2023 CLINICAL DATA:  Abdominal pain with diarrhea and fever EXAM: CT ABDOMEN AND PELVIS WITH CONTRAST TECHNIQUE: Multidetector CT imaging of the abdomen and pelvis was performed using the standard protocol following bolus administration of intravenous contrast. RADIATION DOSE  REDUCTION: This exam was performed according to the departmental dose-optimization program which includes automated exposure control, adjustment of the mA and/or kV according to patient size and/or use of iterative reconstruction technique. CONTRAST:  OMNIPAQUE IOHEXOL 350 MG/ML SOLN COMPARISON:  None Available. FINDINGS: Lower chest: No acute abnormality. Hepatobiliary: Hepatic steatosis. Unremarkable gallbladder and biliary tree. Pancreas: Stable. Spleen: Unremarkable. Adrenals/Urinary Tract: Normal adrenal glands. No urinary calculi or hydronephrosis. Unremarkable bladder. Stomach/Bowel: Postoperative change at the gastroesophageal junction. Partial small bowel resection with neoterminal ileum in the right lower quadrant. The small bowel is dilated upstream from the anastomosis. No definite transition point or stricture. Liquid stool throughout the colon. Vascular/Lymphatic: No significant vascular findings are present. No enlarged abdominal or pelvic lymph nodes. Reproductive: Unremarkable. Other: No free intraperitoneal fluid or air. Musculoskeletal: No acute fracture. IMPRESSION: 1. Partial small bowel resection with ileocolic anastomosis in the right lower quadrant. The small bowel is dilated upstream from the anastomosis without definite transition point or stricture. Findings may represent ileus or partial obstruction. 2. Liquid stool throughout the colon consistent with diarrheal illness. 3. Hepatic steatosis. Electronically Signed   By: Minerva Fester M.D.   On: 10/09/2023 02:12   (Echo, Carotid, EGD, Colonoscopy, ERCP)    Subjective: Pt c/o malaise    Discharge Exam: Vitals:   10/10/23 0331 10/10/23 0746  BP: 127/81 (!) 160/83  Pulse: 64 82  Resp: 20 18  Temp: (!) 97.5 F (36.4 C) 99 F (37.2 C)  SpO2: 100% 97%   Vitals:   10/09/23 1900 10/09/23 2005 10/10/23 0331 10/10/23 0746  BP: (!) 142/80 132/81 127/81 (!) 160/83  Pulse: 78 68 64 82  Resp: 20 20 20 18   Temp: 98.1 F  (36.7 C) (!) 97.5 F (36.4 C) (!) 97.5 F (36.4 C) 99 F (37.2 C)  TempSrc: Axillary Oral Oral Oral  SpO2: 96% 96% 100% 97%  Weight:      Height:        General: Pt is alert, awake, not in acute distress Cardiovascular:  S1/S2 +, no rubs, no gallops Respiratory: CTA bilaterally, no wheezing, no rhonchi Abdominal: Soft, NT, obese, bowel sounds + Extremities: no edema, no cyanosis    The results of significant diagnostics from this hospitalization (including imaging, microbiology, ancillary and laboratory) are listed below for reference.     Microbiology: Recent Results (from the past 240 hours)  Blood culture (routine x 2)     Status: None (Preliminary result)   Collection Time: 10/08/23 11:21 PM   Specimen: BLOOD  Result Value Ref Range Status   Specimen Description BLOOD RIGHT ASSIST CONTROL  Final  Special Requests   Final    BOTTLES DRAWN AEROBIC AND ANAEROBIC Blood Culture results may not be optimal due to an inadequate volume of blood received in culture bottles   Culture   Final    NO GROWTH 1 DAY Performed at [facility], [address], [place], [place] [address]    Report Status PENDING  Incomplete  Blood culture (routine x 2)     Status: None (Preliminary result)   Collection Time: 10/08/23 11:21 PM   Specimen: BLOOD  Result Value Ref Range Status   Specimen Description BLOOD RIGHT FOREARM  Final   Special Requests   Final    BOTTLES DRAWN AEROBIC AND ANAEROBIC Blood Culture results may not be optimal due to an inadequate volume of blood received in culture bottles   Culture   Final    NO GROWTH 1 DAY Performed at [facility], [address], [place], [place] [address]    Report Status PENDING  Incomplete  Resp panel by RT-PCR (RSV, Flu A&B, Covid) Anterior Nasal Swab     Status: Abnormal   Collection Time: 10/09/23 12:51 AM   Specimen: Anterior Nasal Swab  Result Value Ref Range Status   SARS Coronavirus 2 by RT PCR NEGATIVE  NEGATIVE Final    Comment: (NOTE) SARS-CoV-2 target nucleic acids are NOT DETECTED.  The SARS-CoV-2 RNA is generally detectable in upper respiratory specimens during the acute phase of infection. The lowest concentration of SARS-CoV-2 viral copies this assay can detect is 138 copies/mL. A negative result does not preclude SARS-Cov-2 infection and should not be used as the sole basis for treatment or other patient management decisions. A negative result may occur with  improper specimen collection/handling, submission of specimen other than nasopharyngeal swab, presence of viral mutation(s) within the areas targeted by this assay, and inadequate number of viral copies(<138 copies/mL). A negative result must be combined with clinical observations, patient history, and epidemiological information. The expected result is Negative.  Fact Sheet for Patients:  BloggerCourse.com  Fact Sheet for Healthcare Providers:  SeriousBroker.it  This test is no t yet approved or cleared by the [place] FDA and  has been authorized for detection and/or diagnosis of SARS-CoV-2 by FDA under an Emergency Use Authorization (EUA). This EUA will remain  in effect (meaning this test can be used) for the duration of the COVID-19 declaration under Section 564(b)(1) of the Act, 21 U.S.C.section 360bbb-3(b)(1), unless the authorization is terminated  or revoked sooner.       Influenza A by PCR NEGATIVE NEGATIVE Final   Influenza B by PCR NEGATIVE NEGATIVE Final    Comment: (NOTE) The Xpert Xpress SARS-CoV-2/FLU/RSV plus assay is intended as an aid in the diagnosis of influenza from Nasopharyngeal swab specimens and should not be used as a sole basis for treatment. Nasal washings and aspirates are unacceptable for Xpert Xpress SARS-CoV-2/FLU/RSV testing.  Fact Sheet for Patients: BloggerCourse.com  Fact Sheet for Healthcare  Providers: SeriousBroker.it  This test is not yet approved or cleared by the [place] FDA and has been authorized for detection and/or diagnosis of SARS-CoV-2 by FDA under an Emergency Use Authorization (EUA). This EUA will remain in effect (meaning this test can be used) for the duration of the COVID-19 declaration under Section 564(b)(1) of the Act, 21 U.S.C. section 360bbb-3(b)(1), unless the authorization is terminated or revoked.     Resp Syncytial Virus by PCR POSITIVE (A) NEGATIVE Final    Comment: (NOTE) Fact Sheet for Patients:  BloggerCourse.com  Fact Sheet for Healthcare Providers: SeriousBroker.it  This test is not yet approved or cleared by the [place] FDA and has been authorized for detection and/or diagnosis of SARS-CoV-2 by FDA under an Emergency Use Authorization (EUA). This EUA will remain in effect (meaning this test can be used) for the duration of the COVID-19 declaration under Section 564(b)(1) of the Act, 21 U.S.C. section 360bbb-3(b)(1), unless the authorization is terminated or revoked.  Performed at [facility], [address] [place], [place] [address]      Labs: BNP (last 3 results) No results for input(s): "BNP" in the last 8760 hours. Basic Metabolic Panel: Recent Labs  Lab 10/08/23 2322 10/09/23 0644 10/10/23 0439  NA 129*  --  139  K 3.4*  --  3.5  CL 105  --  107  CO2 16*  --  23  GLUCOSE 113*  --  87  BUN 13  --  13  CREATININE 1.35* 1.00 1.07  CALCIUM 9.1  --  8.5*   Liver Function Tests: Recent Labs  Lab 10/08/23 2322  AST 24  ALT 26  ALKPHOS 79  BILITOT 0.6  PROT 7.6  ALBUMIN 4.0   Recent Labs  Lab 10/08/23 2322  LIPASE 38   No results for input(s): "AMMONIA" in the last 168 hours. CBC: Recent Labs  Lab 10/08/23 2322 10/09/23 0644 10/10/23 0439  WBC 8.5 5.3 3.8*  NEUTROABS 6.1  --   --   HGB 16.8 13.3 12.9*   HCT 50.9 40.1 38.5*  MCV 93.9 92.4 91.4  PLT 206 168 172   Cardiac Enzymes: No results for input(s): "CKTOTAL", "CKMB", "CKMBINDEX", "TROPONINI" in the last 168 hours. BNP: Invalid input(s): "POCBNP" CBG: No results for input(s): "GLUCAP" in the last 168 hours. D-Dimer No results for input(s): "DDIMER" in the last 72 hours. Hgb A1c No results for input(s): "HGBA1C" in the last 72 hours. Lipid Profile No results for input(s): "CHOL", "HDL", "LDLCALC", "TRIG", "CHOLHDL", "LDLDIRECT" in the last 72 hours. Thyroid function studies No results for input(s): "TSH", "T4TOTAL", "T3FREE", "THYROIDAB" in the last 72 hours.  Invalid input(s): "FREET3" Anemia work up No results for input(s): "VITAMINB12", "FOLATE", "FERRITIN", "TIBC", "IRON", "RETICCTPCT" in the last 72 hours. Urinalysis    Component Value Date/Time   COLORURINE YELLOW (A) 10/09/2023 0051   APPEARANCEUR HAZY (A) 10/09/2023 0051   LABSPEC 1.027 10/09/2023 0051   PHURINE 5.0 10/09/2023 0051   GLUCOSEU NEGATIVE 10/09/2023 0051   HGBUR MODERATE (A) 10/09/2023 0051   BILIRUBINUR NEGATIVE 10/09/2023 0051   KETONESUR NEGATIVE 10/09/2023 0051   PROTEINUR 100 (A) 10/09/2023 0051   NITRITE NEGATIVE 10/09/2023 0051   LEUKOCYTESUR NEGATIVE 10/09/2023 0051   Sepsis Labs Recent Labs  Lab 10/08/23 2322 10/09/23 0644 10/10/23 0439  WBC 8.5 5.3 3.8*   Microbiology Recent Results (from the past 240 hours)  Blood culture (routine x 2)     Status: None (Preliminary result)   Collection Time: 10/08/23 11:21 PM   Specimen: BLOOD  Result Value Ref Range Status   Specimen Description BLOOD RIGHT ASSIST CONTROL  Final   Special Requests   Final    BOTTLES DRAWN AEROBIC AND ANAEROBIC Blood Culture results may not be optimal due to an inadequate volume of blood received in culture bottles   Culture   Final    NO GROWTH 1 DAY Performed at [facility], [address], [place], [place] [address]    Report Status PENDING   Incomplete  Blood  culture (routine x 2)     Status: None (Preliminary result)   Collection Time: 10/08/23 11:21 PM   Specimen: BLOOD  Result Value Ref Range Status   Specimen Description BLOOD RIGHT FOREARM  Final   Special Requests   Final    BOTTLES DRAWN AEROBIC AND ANAEROBIC Blood Culture results may not be optimal due to an inadequate volume of blood received in culture bottles   Culture   Final    NO GROWTH 1 DAY Performed at [facility], [address], [place], [place] [address]    Report Status PENDING  Incomplete  Resp panel by RT-PCR (RSV, Flu A&B, Covid) Anterior Nasal Swab     Status: Abnormal   Collection Time: 10/09/23 12:51 AM   Specimen: Anterior Nasal Swab  Result Value Ref Range Status   SARS Coronavirus 2 by RT PCR NEGATIVE NEGATIVE Final    Comment: (NOTE) SARS-CoV-2 target nucleic acids are NOT DETECTED.  The SARS-CoV-2 RNA is generally detectable in upper respiratory specimens during the acute phase of infection. The lowest concentration of SARS-CoV-2 viral copies this assay can detect is 138 copies/mL. A negative result does not preclude SARS-Cov-2 infection and should not be used as the sole basis for treatment or other patient management decisions. A negative result may occur with  improper specimen collection/handling, submission of specimen other than nasopharyngeal swab, presence of viral mutation(s) within the areas targeted by this assay, and inadequate number of viral copies(<138 copies/mL). A negative result must be combined with clinical observations, patient history, and epidemiological information. The expected result is Negative.  Fact Sheet for Patients:  BloggerCourse.com  Fact Sheet for Healthcare Providers:  SeriousBroker.it  This test is no t yet approved or cleared by the [place] FDA and  has been authorized for detection and/or diagnosis of SARS-CoV-2 by FDA under an  Emergency Use Authorization (EUA). This EUA will remain  in effect (meaning this test can be used) for the duration of the COVID-19 declaration under Section 564(b)(1) of the Act, 21 U.S.C.section 360bbb-3(b)(1), unless the authorization is terminated  or revoked sooner.       Influenza A by PCR NEGATIVE NEGATIVE Final   Influenza B by PCR NEGATIVE NEGATIVE Final    Comment: (NOTE) The Xpert Xpress SARS-CoV-2/FLU/RSV plus assay is intended as an aid in the diagnosis of influenza from Nasopharyngeal swab specimens and should not be used as a sole basis for treatment. Nasal washings and aspirates are unacceptable for Xpert Xpress SARS-CoV-2/FLU/RSV testing.  Fact Sheet for Patients: BloggerCourse.com  Fact Sheet for Healthcare Providers: SeriousBroker.it  This test is not yet approved or cleared by the [place] FDA and has been authorized for detection and/or diagnosis of SARS-CoV-2 by FDA under an Emergency Use Authorization (EUA). This EUA will remain in effect (meaning this test can be used) for the duration of the COVID-19 declaration under Section 564(b)(1) of the Act, 21 U.S.C. section 360bbb-3(b)(1), unless the authorization is terminated or revoked.     Resp Syncytial Virus by PCR POSITIVE (A) NEGATIVE Final    Comment: (NOTE) Fact Sheet for Patients: BloggerCourse.com  Fact Sheet for Healthcare Providers: SeriousBroker.it  This test is not yet approved or cleared by the [place] FDA and has been authorized for detection and/or diagnosis of SARS-CoV-2 by FDA under an Emergency Use Authorization (EUA). This EUA will remain in effect (meaning this test can be used) for the duration of the COVID-19 declaration under Section 564(b)(1) of the Act, 21 U.S.C.  section 360bbb-3(b)(1), unless the authorization is terminated or revoked.  Performed at [facility], [address] [place], [place] [address]      Time coordinating discharge: Over 30 minutes  SIGNED:   Charise Killian, MD  [facility] 10/10/2023, 1:20 PM Pager   If 7PM-7AM, please contact night-coverage [email or web address]

## 2023-10-10 NOTE — Plan of Care (Signed)
  Problem: Nutrition: Goal: Adequate nutrition will be maintained Outcome: Progressing   Problem: Elimination: Goal: Will not experience complications related to bowel motility Outcome: Progressing   

## 2023-10-14 LAB — CULTURE, BLOOD (ROUTINE X 2)
Culture: NO GROWTH
Culture: NO GROWTH

## 2023-10-23 ENCOUNTER — Other Ambulatory Visit: Payer: Self-pay | Admitting: Orthopedic Surgery

## 2023-10-23 DIAGNOSIS — Z96651 Presence of right artificial knee joint: Secondary | ICD-10-CM

## 2023-11-01 ENCOUNTER — Ambulatory Visit
Admission: RE | Admit: 2023-11-01 | Discharge: 2023-11-01 | Disposition: A | Discharge status: Home / Self Care | Payer: No Typology Code available for payment source | Source: Ambulatory Visit | Attending: Orthopedic Surgery | Admitting: Orthopedic Surgery

## 2023-11-01 DIAGNOSIS — Z96651 Presence of right artificial knee joint: Secondary | ICD-10-CM | POA: Insufficient documentation
